# Patient Record
Sex: Male | Born: 1985 | Race: White | Hispanic: No | Marital: Single | Smoking: Current every day smoker
Health system: Southern US, Community
[De-identification: ages and names within clinical notes are randomized; demographics above are authoritative.]

## PROBLEM LIST (undated history)

## (undated) DIAGNOSIS — J189 Pneumonia, unspecified organism: Secondary | ICD-10-CM

## (undated) DIAGNOSIS — F419 Anxiety disorder, unspecified: Secondary | ICD-10-CM

## (undated) DIAGNOSIS — F909 Attention-deficit hyperactivity disorder, unspecified type: Secondary | ICD-10-CM

## (undated) DIAGNOSIS — B1910 Unspecified viral hepatitis B without hepatic coma: Secondary | ICD-10-CM

## (undated) DIAGNOSIS — F32A Depression, unspecified: Secondary | ICD-10-CM

## (undated) HISTORY — PX: NO PAST SURGERIES: SHX2092

---

## 2008-12-03 ENCOUNTER — Emergency Department (HOSPITAL_COMMUNITY): Admission: EM | Admit: 2008-12-03 | Discharge: 2008-12-03 | Payer: Self-pay | Admitting: Emergency Medicine

## 2011-09-12 LAB — RAPID URINE DRUG SCREEN, HOSP PERFORMED
Barbiturates: NOT DETECTED
Opiates: POSITIVE — AB
Tetrahydrocannabinol: POSITIVE — AB

## 2011-09-12 LAB — BASIC METABOLIC PANEL
Chloride: 102 mEq/L (ref 96–112)
GFR calc non Af Amer: >60 mL/min (ref >60)
Glucose, Bld: 118 mg/dL — ABNORMAL HIGH (ref 70–99)
Potassium: 4.1 mEq/L (ref 3.5–5.1)
Sodium: 140 mEq/L (ref 135–145)

## 2013-10-20 ENCOUNTER — Ambulatory Visit: Payer: Self-pay | Admitting: Family Medicine

## 2013-10-24 ENCOUNTER — Encounter (INDEPENDENT_AMBULATORY_CARE_PROVIDER_SITE_OTHER): Payer: Self-pay

## 2013-10-24 ENCOUNTER — Ambulatory Visit (INDEPENDENT_AMBULATORY_CARE_PROVIDER_SITE_OTHER): Payer: Self-pay | Admitting: Family Medicine

## 2013-10-24 ENCOUNTER — Encounter: Payer: Self-pay | Admitting: Family Medicine

## 2013-10-24 ENCOUNTER — Ambulatory Visit (INDEPENDENT_AMBULATORY_CARE_PROVIDER_SITE_OTHER): Payer: Self-pay

## 2013-10-24 VITALS — BP 136/88 | HR 91 | Temp 98.4°F | Ht 66.0 in | Wt 136.4 lb

## 2013-10-24 DIAGNOSIS — M549 Dorsalgia, unspecified: Secondary | ICD-10-CM

## 2013-10-24 DIAGNOSIS — M546 Pain in thoracic spine: Secondary | ICD-10-CM

## 2013-10-24 MED ORDER — NAPROXEN 500 MG PO TABS: 500.0000 mg | ORAL_TABLET | Freq: Two times a day (BID) | ORAL | Status: DC

## 2013-10-24 MED ORDER — CYCLOBENZAPRINE HCL 10 MG PO TABS: 10.0000 mg | ORAL_TABLET | Freq: Three times a day (TID) | ORAL | Status: DC | PRN
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
## 2018-12-04 ENCOUNTER — Emergency Department (HOSPITAL_COMMUNITY): Payer: Self-pay

## 2018-12-04 ENCOUNTER — Encounter (HOSPITAL_COMMUNITY): Payer: Self-pay

## 2018-12-04 ENCOUNTER — Emergency Department (HOSPITAL_COMMUNITY)
Admission: EM | Admit: 2018-12-04 | Discharge: 2018-12-04 | Disposition: A | Discharge status: Home / Self Care | Payer: Self-pay | Attending: Emergency Medicine | Admitting: Emergency Medicine

## 2018-12-04 DIAGNOSIS — S6291XA Unspecified fracture of right wrist and hand, initial encounter for closed fracture: Secondary | ICD-10-CM

## 2018-12-04 DIAGNOSIS — S0990XA Unspecified injury of head, initial encounter: Secondary | ICD-10-CM | POA: Insufficient documentation

## 2018-12-04 DIAGNOSIS — S161XXA Strain of muscle, fascia and tendon at neck level, initial encounter: Secondary | ICD-10-CM | POA: Insufficient documentation

## 2018-12-04 DIAGNOSIS — Y929 Unspecified place or not applicable: Secondary | ICD-10-CM | POA: Insufficient documentation

## 2018-12-04 DIAGNOSIS — Y9389 Activity, other specified: Secondary | ICD-10-CM | POA: Insufficient documentation

## 2018-12-04 DIAGNOSIS — S51011A Laceration without foreign body of right elbow, initial encounter: Secondary | ICD-10-CM | POA: Insufficient documentation

## 2018-12-04 DIAGNOSIS — Y999 Unspecified external cause status: Secondary | ICD-10-CM | POA: Insufficient documentation

## 2018-12-04 LAB — COMPREHENSIVE METABOLIC PANEL
ALT: 14 U/L (ref 0–44)
AST: 23 U/L (ref 15–41)
Albumin: 4.3 g/dL (ref 3.5–5.0)
Alkaline Phosphatase: 58 U/L (ref 38–126)
Anion gap: 11 (ref 5–15)
BUN: 13 mg/dL (ref 6–20)
CO2: 20 mmol/L — ABNORMAL LOW (ref 22–32)
Calcium: 9.2 mg/dL (ref 8.9–10.3)
Chloride: 106 mmol/L (ref 98–111)
Creatinine, Ser: 0.93 mg/dL (ref 0.61–1.24)
GFR calc non Af Amer: >60 mL/min (ref >60)
Glucose, Bld: 93 mg/dL (ref 70–99)
POTASSIUM: 4.2 mmol/L (ref 3.5–5.1)
Sodium: 137 mmol/L (ref 135–145)
Total Bilirubin: 0.5 mg/dL (ref 0.3–1.2)
Total Protein: 7 g/dL (ref 6.5–8.1)

## 2018-12-04 LAB — CBC
HCT: 50.4 % (ref 39.0–52.0)
Hemoglobin: 16.4 g/dL (ref 13.0–17.0)
MCH: 31.9 pg (ref 26.0–34.0)
MCHC: 32.5 g/dL (ref 30.0–36.0)
MCV: 98.1 fL (ref 80.0–100.0)
Platelets: 223 10*3/uL (ref 150–400)
RBC: 5.14 MIL/uL (ref 4.22–5.81)
RDW: 12.9 % (ref 11.5–15.5)
WBC: 10.2 10*3/uL (ref 4.0–10.5)
nRBC: 0 % (ref 0.0–0.2)

## 2018-12-04 LAB — ETHANOL: ALCOHOL ETHYL (B): 77 mg/dL — AB (ref <10)

## 2018-12-04 MED ORDER — FENTANYL CITRATE (PF) 100 MCG/2ML IJ SOLN
INTRAMUSCULAR | Status: AC
  Filled 2018-12-04: qty 2

## 2018-12-04 MED ORDER — LIDOCAINE-EPINEPHRINE (PF) 2 %-1:200000 IJ SOLN
10.0000 mL | Freq: Once | INTRAMUSCULAR | Status: DC
  Filled 2018-12-04: qty 20

## 2018-12-04 MED ORDER — FENTANYL CITRATE (PF) 100 MCG/2ML IJ SOLN
INTRAMUSCULAR | Status: AC | PRN
  Administered 2018-12-04: 100 ug via INTRAVENOUS

## 2018-12-04 MED ORDER — IBUPROFEN 800 MG PO TABS: 800.0000 mg | ORAL_TABLET | Freq: Three times a day (TID) | ORAL | 0 refills | Status: DC | PRN

## 2018-12-04 MED ORDER — METHOCARBAMOL 500 MG PO TABS: 500.0000 mg | ORAL_TABLET | Freq: Four times a day (QID) | ORAL | 0 refills | Status: DC | PRN
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
## 2018-12-04 NOTE — ED Notes (Signed)
Patient transported to CT 

## 2018-12-04 NOTE — ED Notes (Signed)
Discharge instructions reviewed with patient and patients family. Patients mother states ortho gave prescriptions.

## 2018-12-06 ENCOUNTER — Encounter: Payer: Self-pay | Admitting: Family Medicine

## 2018-12-22 DIAGNOSIS — S62316A Displaced fracture of base of fifth metacarpal bone, right hand, initial encounter for closed fracture: Secondary | ICD-10-CM | POA: Insufficient documentation

## 2018-12-22 DIAGNOSIS — S62629A Displaced fracture of medial phalanx of unspecified finger, initial encounter for closed fracture: Secondary | ICD-10-CM | POA: Insufficient documentation
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
## 2023-07-27 ENCOUNTER — Encounter (HOSPITAL_COMMUNITY): Payer: Self-pay

## 2023-07-27 ENCOUNTER — Emergency Department (HOSPITAL_COMMUNITY): Payer: MEDICAID

## 2023-07-27 ENCOUNTER — Emergency Department (HOSPITAL_COMMUNITY)
Admission: EM | Admit: 2023-07-27 | Discharge: 2023-07-28 | Disposition: A | Discharge status: Home / Self Care | Payer: MEDICAID | Attending: Emergency Medicine | Admitting: Emergency Medicine

## 2023-07-27 ENCOUNTER — Other Ambulatory Visit: Payer: Self-pay

## 2023-07-27 DIAGNOSIS — S68114A Complete traumatic metacarpophalangeal amputation of right ring finger, initial encounter: Secondary | ICD-10-CM | POA: Insufficient documentation

## 2023-07-27 DIAGNOSIS — W28XXXA Contact with powered lawn mower, initial encounter: Secondary | ICD-10-CM | POA: Diagnosis not present

## 2023-07-27 DIAGNOSIS — S68119A Complete traumatic metacarpophalangeal amputation of unspecified finger, initial encounter: Secondary | ICD-10-CM

## 2023-07-27 DIAGNOSIS — S6991XA Unspecified injury of right wrist, hand and finger(s), initial encounter: Secondary | ICD-10-CM | POA: Diagnosis present

## 2023-07-27 DIAGNOSIS — Z23 Encounter for immunization: Secondary | ICD-10-CM | POA: Diagnosis not present

## 2023-07-27 DIAGNOSIS — S68112A Complete traumatic metacarpophalangeal amputation of right middle finger, initial encounter: Secondary | ICD-10-CM | POA: Diagnosis not present

## 2023-07-27 HISTORY — DX: Unspecified viral hepatitis B without hepatic coma: B19.10

## 2023-07-27 LAB — CBC
HCT: 40.1 % (ref 39.0–52.0)
Hemoglobin: 13.8 g/dL (ref 13.0–17.0)
MCH: 32.1 pg (ref 26.0–34.0)
MCHC: 34.4 g/dL (ref 30.0–36.0)
MCV: 93.3 fL (ref 80.0–100.0)
Platelets: 181 10*3/uL (ref 150–400)
RBC: 4.3 MIL/uL (ref 4.22–5.81)
RDW: 12.9 % (ref 11.5–15.5)
WBC: 10 10*3/uL (ref 4.0–10.5)
nRBC: 0 % (ref 0.0–0.2)

## 2023-07-27 LAB — BASIC METABOLIC PANEL
Anion gap: 9 (ref 5–15)
BUN: 8 mg/dL (ref 6–20)
CO2: 25 mmol/L (ref 22–32)
Calcium: 8.4 mg/dL — ABNORMAL LOW (ref 8.9–10.3)
Chloride: 105 mmol/L (ref 98–111)
Creatinine, Ser: 1.01 mg/dL (ref 0.61–1.24)
GFR, Estimated: >60 mL/min (ref >60)
Glucose, Bld: 119 mg/dL — ABNORMAL HIGH (ref 70–99)
Potassium: 3.8 mmol/L (ref 3.5–5.1)
Sodium: 139 mmol/L (ref 135–145)

## 2023-07-27 LAB — HEPATIC FUNCTION PANEL
ALT: 14 U/L (ref 0–44)
AST: 19 U/L (ref 15–41)
Albumin: 3.8 g/dL (ref 3.5–5.0)
Alkaline Phosphatase: 49 U/L (ref 38–126)
Bilirubin, Direct: <0.1 mg/dL (ref 0.0–0.2)
Total Bilirubin: 0.9 mg/dL (ref 0.3–1.2)
Total Protein: 6.4 g/dL — ABNORMAL LOW (ref 6.5–8.1)

## 2023-07-27 LAB — TYPE AND SCREEN
ABO/RH(D): A POS
Antibody Screen: NEGATIVE

## 2023-07-27 LAB — ETHANOL: Alcohol, Ethyl (B): 125 mg/dL — ABNORMAL HIGH (ref <10)

## 2023-07-27 MED ORDER — TETANUS-DIPHTH-ACELL PERTUSSIS 5-2.5-18.5 LF-MCG/0.5 IM SUSY
0.5000 mL | PREFILLED_SYRINGE | Freq: Once | INTRAMUSCULAR | Status: AC
  Administered 2023-07-27: 0.5 mL via INTRAMUSCULAR
  Filled 2023-07-27: qty 0.5

## 2023-07-27 MED ORDER — CEFAZOLIN SODIUM-DEXTROSE 1-4 GM/50ML-% IV SOLN
1.0000 g | Freq: Once | INTRAVENOUS | Status: AC
  Administered 2023-07-27: 1 g via INTRAVENOUS
  Filled 2023-07-27: qty 50

## 2023-07-27 MED ORDER — HYDROMORPHONE HCL 1 MG/ML IJ SOLN
1.0000 mg | Freq: Once | INTRAMUSCULAR | Status: AC
  Administered 2023-07-27: 1 mg via INTRAVENOUS
  Filled 2023-07-27: qty 1

## 2023-07-27 MED ORDER — HYDROMORPHONE HCL 1 MG/ML IJ SOLN
0.5000 mg | Freq: Once | INTRAMUSCULAR | Status: AC
  Administered 2023-07-27: 0.5 mg via INTRAVENOUS
  Filled 2023-07-27: qty 1
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
## 2023-07-27 NOTE — ED Triage Notes (Signed)
BIB EMS/ pt cut right fingers with lawn mower blade/ right middle-tip of finger is amputated/ laceration to all other fingers/ bleeding controlled/ pt A&Ox4/ Fentanyl given enroute/ 7/10 pain upon arrival.
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
## 2023-07-27 NOTE — ED Provider Notes (Signed)
[facility] AT [facility] Provider Note   CSN: [id number] Arrival date & time: 07/27/23  2130     History {Add pertinent medical, surgical, social history, OB history to HPI:1} Chief Complaint  Patient presents with   Hand Injury    Alan Herrera. is a 37 y.o. male.  The history is provided by the patient and medical records.  Hand Injury  37 year old male with history of hepatitis B, presenting to the ED for right hand injury.  Reports he was mowing the lawn when mower got clogged up with damp grass.  He states he tried to clear this quickly with his fingertips, however hand got drawn into the blade.  He suffered partial amputations of right third and fourth digits and some other smaller lacerations.  He is right-hand dominant.  Does have EtOH on board.  Unsure of last tetanus.  Home Medications Prior to Admission medications   Medication Sig Start Date End Date Taking? Authorizing Provider  cyclobenzaprine (FLEXERIL) 10 MG tablet Take 1 tablet (10 mg total) by mouth 3 (three) times daily as needed for muscle spasms. 10/24/13   Deatra Canter, FNP  naproxen (NAPROSYN) 500 MG tablet Take 1 tablet (500 mg total) by mouth 2 (two) times daily with a meal. 10/24/13   [place], Anselm Pancoast, FNP      Allergies    Patient has no known allergies.    Review of Systems   Review of Systems  Skin:  Positive for wound.  All other systems reviewed and are negative.   Physical Exam Updated Vital Signs BP (!) 138/91 (BP Location: Left Arm)   Pulse 74   Temp 98.1 F (36.7 C) (Oral)   Resp 16   Wt 56.7 kg   SpO2 100%   BMI 19.58 kg/m   Physical Exam Vitals and nursing note reviewed.  Constitutional:      Appearance: He is well-developed.  HENT:     Head: Normocephalic and atraumatic.  Eyes:     Conjunctiva/sclera: Conjunctivae normal.     Pupils: Pupils are equal, round, and reactive to light.  Cardiovascular:     Rate and Rhythm: Normal  rate and regular rhythm.     Heart sounds: Normal heart sounds.  Pulmonary:     Effort: Pulmonary effort is normal.     Breath sounds: Normal breath sounds.  Abdominal:     General: Bowel sounds are normal.     Palpations: Abdomen is soft.  Musculoskeletal:        General: Normal range of motion.     Cervical back: Normal range of motion.     Comments: Right hand with amputations to right 3rd distal tip and 4th distal fingers palmar aspect, 4th digit also has deep dorsal laceration along DIP joint, there is some oozing from wounds but no pulsatile bleeding, able to flex/extend, maintains sensation   Skin:    General: Skin is warm and dry.  Neurological:     Mental Status: He is alert and oriented to person, place, and time.        ED Results / Procedures / Treatments   Labs (all labs ordered are listed, but only abnormal results are displayed) Labs Reviewed  BASIC METABOLIC PANEL - Abnormal; Notable for the following components:      Result Value   Glucose, Bld 119 (*)    Calcium 8.4 (*)    All other components within normal limits  ETHANOL - Abnormal; Notable  for the following components:   Alcohol, Ethyl (B) 125 (*)    All other components within normal limits  HEPATIC FUNCTION PANEL - Abnormal; Notable for the following components:   Total Protein 6.4 (*)    All other components within normal limits  CBC  TYPE AND SCREEN    EKG None  Radiology DG Hand Complete Right  Result Date: 07/27/2023 CLINICAL DATA:  Traumatic amputation.  Lawnmower EXAM: RIGHT HAND - COMPLETE 3+ VIEW COMPARISON:  None Available. FINDINGS: Study limited due to imaging in the fingers in the slightly flexed position. Fractures noted through the distal phalanx of the right middle finger and middle phalanx of the right ring finger. No definite additional fractures. Deformity at the base of the right 5th metacarpal likely related to old healed injury. Old ulnar styloid fracture. IMPRESSION: Limited  study. Fractures through the distal phalanx of the middle finger and middle phalanx of the ring finger. Electronically Signed   By: Charlett Nose M.D.   On: 07/27/2023 23:15   DG Wrist Complete Right  Result Date: 07/27/2023 CLINICAL DATA:  Lawnmower injury. traumatic amputation EXAM: RIGHT WRIST - COMPLETE 3+ VIEW COMPARISON:  None Available. FINDINGS: Deformity at the base of the right 5th metacarpal appears to be chronic, likely related to old healed fracture. Old ulnar styloid fracture noted. No acute fracture within the wrist. No subluxation or dislocation. IMPRESSION: No acute bony abnormality within the wrist. Electronically Signed   By: Charlett Nose M.D.   On: 07/27/2023 23:13    Procedures Procedures  {Document cardiac monitor, telemetry assessment procedure when appropriate:1}  Medications Ordered in ED Medications  HYDROmorphone (DILAUDID) injection 0.5 mg (0.5 mg Intravenous Given 07/27/23 2141)    ED Course/ Medical Decision Making/ A&P   {   Click here for ABCD2, HEART and other calculatorsREFRESH Note before signing :1}                              Medical Decision Making Amount and/or Complexity of Data Reviewed Labs: ordered.  Risk Prescription drug management.   37 y.o. M here with hand vs lawnmower while trying to clear grass caked in the blades.  States he does this all the time, however this time had got sucked into the blade.  He has amputation of right 3rd distal finger at DIP joint and partial amputation of right 4th digit-- essentially has sheared off palmar aspect distal phalanx, secondary wound middle phalanx with open fracture.  Tetanus updated, ancef given.    Discussed with hand surgery, Dr. Frazier Butt-- call office in AM, will see first thing.  Plan for outpatient repair on Wednesday at surgical center.  Recommends xeroform, non-stick dressing, dorsal splint.  Rx keflex.  Final Clinical Impression(s) / ED Diagnoses Final diagnoses:  None    Rx / DC  Orders ED Discharge Orders     None

## 2023-07-28 MED ORDER — CEPHALEXIN 500 MG PO CAPS: 500.0000 mg | ORAL_CAPSULE | Freq: Four times a day (QID) | ORAL | 0 refills | Status: DC

## 2023-07-28 MED ORDER — HYDROMORPHONE HCL 1 MG/ML IJ SOLN
1.0000 mg | Freq: Once | INTRAMUSCULAR | Status: AC
  Administered 2023-07-28: 1 mg via INTRAVENOUS
  Filled 2023-07-28: qty 1

## 2023-07-28 MED ORDER — OXYCODONE-ACETAMINOPHEN 5-325 MG PO TABS: 1.0000 | ORAL_TABLET | ORAL | 0 refills | Status: DC | PRN

## 2023-07-28 NOTE — Discharge Instructions (Signed)
Follow-up with Dr. Frazier Butt in office in the morning-- I would call first thing and they will give you time to come in. Leave dressings in place for now. Take the prescribed medication as directed. Return to the ED for new or worsening symptoms.

## 2023-07-28 NOTE — Progress Notes (Signed)
Orthopedic Tech Progress Note Patient Details:  Alan Herrera [birth year] [id number]  Ortho Devices Type of Ortho Device: Other (comment) Ortho Device/Splint Location: Dorsal/Volar RUE Ortho Device/Splint Interventions: Ordered, Application, Adjustment   Post Interventions Patient Tolerated: Well Instructions Provided: Care of device High dorsal/volar applied to keep patients fingers straight. [name] 07/28/2023, 1:13 AM

## 2023-07-28 NOTE — ED Notes (Signed)
Right 2nd, 3rd, 4th finger cleaned and irrigated with NS/ wrapped with xeroform and wrapped with kerlix/ orhto tech at bedside to apply splint

## 2023-07-29 ENCOUNTER — Other Ambulatory Visit: Payer: Self-pay

## 2023-07-29 ENCOUNTER — Encounter (HOSPITAL_COMMUNITY): Payer: Self-pay | Admitting: Orthopedic Surgery

## 2023-07-29 NOTE — Progress Notes (Signed)
PCP - none Cardiologist - none  Chest x-ray - n/a EKG - n/a Stress Test - n/a ECHO - n/a Cardiac Cath - n/a  ICD Pacemaker/Loop - n/a  Sleep Study -  n/a CPAP - none  Diabetes - n/a  ERAS: Clear liquids til 2 PM DOS.  STOP now taking any Aspirin (unless otherwise instructed by your surgeon), Aleve, Naproxen, Ibuprofen, Motrin, Advil, Goody's, BC's, all herbal medications, fish oil, and all vitamins.   Coronavirus Screening Do you have any of the following symptoms:  Cough yes/no: No Fever (>100.27F)  yes/no: No Runny nose yes/no: No Sore throat yes/no: No Difficulty breathing/shortness of breath  yes/no: No  Have you traveled in the last 14 days and where? yes/no: No  Patient verbalized understanding of instructions that were given via phone.

## 2023-07-30 ENCOUNTER — Ambulatory Visit (HOSPITAL_COMMUNITY): Payer: MEDICAID

## 2023-07-30 ENCOUNTER — Encounter (HOSPITAL_COMMUNITY): Payer: Self-pay | Admitting: Orthopedic Surgery

## 2023-07-30 ENCOUNTER — Encounter (HOSPITAL_COMMUNITY)
Admission: RE | Disposition: A | Discharge status: Home / Self Care | Payer: Self-pay | Source: Home / Self Care | Attending: Orthopedic Surgery

## 2023-07-30 ENCOUNTER — Ambulatory Visit (HOSPITAL_BASED_OUTPATIENT_CLINIC_OR_DEPARTMENT_OTHER): Payer: MEDICAID

## 2023-07-30 ENCOUNTER — Ambulatory Visit (HOSPITAL_COMMUNITY)
Admission: RE | Admit: 2023-07-30 | Discharge: 2023-07-30 | Disposition: A | Discharge status: Home / Self Care | Payer: MEDICAID | Attending: Orthopedic Surgery | Admitting: Orthopedic Surgery

## 2023-07-30 ENCOUNTER — Other Ambulatory Visit: Payer: Self-pay

## 2023-07-30 DIAGNOSIS — W28XXXA Contact with powered lawn mower, initial encounter: Secondary | ICD-10-CM | POA: Diagnosis not present

## 2023-07-30 DIAGNOSIS — F418 Other specified anxiety disorders: Secondary | ICD-10-CM | POA: Diagnosis not present

## 2023-07-30 DIAGNOSIS — F17210 Nicotine dependence, cigarettes, uncomplicated: Secondary | ICD-10-CM | POA: Diagnosis not present

## 2023-07-30 DIAGNOSIS — S62630B Displaced fracture of distal phalanx of right index finger, initial encounter for open fracture: Secondary | ICD-10-CM | POA: Insufficient documentation

## 2023-07-30 DIAGNOSIS — S68612A Complete traumatic transphalangeal amputation of right middle finger, initial encounter: Secondary | ICD-10-CM | POA: Diagnosis present

## 2023-07-30 DIAGNOSIS — Z89021 Acquired absence of right finger(s): Secondary | ICD-10-CM | POA: Diagnosis not present

## 2023-07-30 DIAGNOSIS — S62624B Displaced fracture of medial phalanx of right ring finger, initial encounter for open fracture: Secondary | ICD-10-CM | POA: Diagnosis not present

## 2023-07-30 HISTORY — DX: Attention-deficit hyperactivity disorder, unspecified type: F90.9

## 2023-07-30 HISTORY — PX: INCISION AND DRAINAGE OF WOUND: SHX1803

## 2023-07-30 HISTORY — DX: Pneumonia, unspecified organism: J18.9

## 2023-07-30 HISTORY — PX: CLOSED REDUCTION FINGER WITH PERCUTANEOUS PINNING: SHX5612

## 2023-07-30 HISTORY — PX: AMPUTATION: SHX166

## 2023-07-30 HISTORY — DX: Depression, unspecified: F32.A

## 2023-07-30 HISTORY — DX: Anxiety disorder, unspecified: F41.9

## 2023-07-30 SURGERY — IRRIGATION AND DEBRIDEMENT WOUND
Anesthesia: General | Site: Hand | Laterality: Right

## 2023-07-30 MED ORDER — MIDAZOLAM HCL 2 MG/2ML IJ SOLN
INTRAMUSCULAR | Status: AC
  Filled 2023-07-30: qty 2

## 2023-07-30 MED ORDER — ROCURONIUM BROMIDE 10 MG/ML (PF) SYRINGE
PREFILLED_SYRINGE | INTRAVENOUS | Status: DC | PRN
  Administered 2023-07-30: 50 mg via INTRAVENOUS

## 2023-07-30 MED ORDER — GLYCOPYRROLATE PF 0.2 MG/ML IJ SOSY
PREFILLED_SYRINGE | INTRAMUSCULAR | Status: AC
  Filled 2023-07-30: qty 1

## 2023-07-30 MED ORDER — PROPOFOL 10 MG/ML IV BOLUS
INTRAVENOUS | Status: AC
  Filled 2023-07-30: qty 20

## 2023-07-30 MED ORDER — SUCCINYLCHOLINE CHLORIDE 200 MG/10ML IV SOSY
PREFILLED_SYRINGE | INTRAVENOUS | Status: DC | PRN
  Administered 2023-07-30: 100 mg via INTRAVENOUS

## 2023-07-30 MED ORDER — NEOSTIGMINE METHYLSULFATE 3 MG/3ML IV SOSY
PREFILLED_SYRINGE | INTRAVENOUS | Status: AC
  Filled 2023-07-30: qty 3

## 2023-07-30 MED ORDER — 0.9 % SODIUM CHLORIDE (POUR BTL) OPTIME
TOPICAL | Status: DC | PRN
  Administered 2023-07-30: 1000 mL

## 2023-07-30 MED ORDER — FENTANYL CITRATE (PF) 250 MCG/5ML IJ SOLN
INTRAMUSCULAR | Status: DC | PRN
  Administered 2023-07-30 (×2): 50 ug via INTRAVENOUS
  Administered 2023-07-30: 100 ug via INTRAVENOUS

## 2023-07-30 MED ORDER — DEXAMETHASONE SODIUM PHOSPHATE 10 MG/ML IJ SOLN
INTRAMUSCULAR | Status: AC
  Filled 2023-07-30: qty 1

## 2023-07-30 MED ORDER — AMISULPRIDE (ANTIEMETIC) 5 MG/2ML IV SOLN: 10.0000 mg | Freq: Once | INTRAVENOUS | Status: DC | PRN

## 2023-07-30 MED ORDER — LIDOCAINE 2% (20 MG/ML) 5 ML SYRINGE
INTRAMUSCULAR | Status: AC
  Filled 2023-07-30: qty 5

## 2023-07-30 MED ORDER — CEFAZOLIN SODIUM-DEXTROSE 2-4 GM/100ML-% IV SOLN
2.0000 g | INTRAVENOUS | Status: AC
  Administered 2023-07-30: 2 g via INTRAVENOUS
  Filled 2023-07-30: qty 100

## 2023-07-30 MED ORDER — ONDANSETRON HCL 4 MG/2ML IJ SOLN: 4.0000 mg | Freq: Once | INTRAMUSCULAR | Status: DC | PRN

## 2023-07-30 MED ORDER — SUGAMMADEX SODIUM 200 MG/2ML IV SOLN
INTRAVENOUS | Status: DC | PRN
  Administered 2023-07-30: 200 mg via INTRAVENOUS

## 2023-07-30 MED ORDER — DEXAMETHASONE SODIUM PHOSPHATE 10 MG/ML IJ SOLN
INTRAMUSCULAR | Status: DC | PRN
  Administered 2023-07-30: 4 mg via INTRAVENOUS

## 2023-07-30 MED ORDER — KETOROLAC TROMETHAMINE 30 MG/ML IJ SOLN
INTRAMUSCULAR | Status: AC
  Filled 2023-07-30: qty 1

## 2023-07-30 MED ORDER — OXYCODONE HCL 5 MG PO TABS: 5.0000 mg | ORAL_TABLET | ORAL | 0 refills | Status: AC | PRN

## 2023-07-30 MED ORDER — BUPIVACAINE HCL (PF) 0.25 % IJ SOLN
INTRAMUSCULAR | Status: DC | PRN
  Administered 2023-07-30: 20 mL

## 2023-07-30 MED ORDER — FENTANYL CITRATE (PF) 250 MCG/5ML IJ SOLN
INTRAMUSCULAR | Status: AC
  Filled 2023-07-30: qty 5

## 2023-07-30 MED ORDER — ONDANSETRON HCL 4 MG/2ML IJ SOLN
INTRAMUSCULAR | Status: DC | PRN
  Administered 2023-07-30: 4 mg via INTRAVENOUS

## 2023-07-30 MED ORDER — BUPIVACAINE HCL (PF) 0.25 % IJ SOLN
INTRAMUSCULAR | Status: AC
  Filled 2023-07-30: qty 30

## 2023-07-30 MED ORDER — LIDOCAINE 2% (20 MG/ML) 5 ML SYRINGE
INTRAMUSCULAR | Status: DC | PRN
  Administered 2023-07-30: 60 mg via INTRAVENOUS

## 2023-07-30 MED ORDER — ONDANSETRON HCL 4 MG/2ML IJ SOLN
INTRAMUSCULAR | Status: AC
  Filled 2023-07-30: qty 2

## 2023-07-30 MED ORDER — SODIUM CHLORIDE 0.9 % IR SOLN
Status: DC | PRN
  Administered 2023-07-30 (×2): 3000 mL

## 2023-07-30 MED ORDER — MIDAZOLAM HCL 2 MG/2ML IJ SOLN
INTRAMUSCULAR | Status: DC | PRN
  Administered 2023-07-30: 2 mg via INTRAVENOUS

## 2023-07-30 MED ORDER — FENTANYL CITRATE (PF) 100 MCG/2ML IJ SOLN: 25.0000 ug | INTRAMUSCULAR | Status: DC | PRN

## 2023-07-30 MED ORDER — CHLORHEXIDINE GLUCONATE 0.12 % MT SOLN
OROMUCOSAL | Status: AC
  Filled 2023-07-30: qty 15

## 2023-07-30 MED ORDER — CEPHALEXIN 500 MG PO CAPS: 500.0000 mg | ORAL_CAPSULE | Freq: Four times a day (QID) | ORAL | 0 refills | Status: AC

## 2023-07-30 MED ORDER — PROPOFOL 10 MG/ML IV BOLUS
INTRAVENOUS | Status: DC | PRN
  Administered 2023-07-30: 100 mg via INTRAVENOUS

## 2023-07-30 MED ORDER — LACTATED RINGERS IV SOLN: INTRAVENOUS | Status: DC | PRN

## 2023-07-30 SURGICAL SUPPLY — 86 items
APL PRP STRL LF DISP 70% ISPRP (MISCELLANEOUS) ×3
BAG COUNTER SPONGE SURGICOUNT (BAG) ×3 IMPLANT
BAG SPNG CNTER NS LX DISP (BAG) ×3
BNDG CMPR 5X3 KNIT ELC UNQ LF (GAUZE/BANDAGES/DRESSINGS) ×6
BNDG CMPR 75X21 PLY HI ABS (MISCELLANEOUS)
BNDG CMPR 9X4 STRL LF SNTH (GAUZE/BANDAGES/DRESSINGS)
BNDG COHESIVE 1X5 TAN STRL LF (GAUZE/BANDAGES/DRESSINGS) ×3 IMPLANT
BNDG ELASTIC 2X5.8 VLCR STR LF (GAUZE/BANDAGES/DRESSINGS) ×3 IMPLANT
BNDG ELASTIC 3INX 5YD STR LF (GAUZE/BANDAGES/DRESSINGS) ×3 IMPLANT
BNDG ELASTIC 4X5.8 VLCR STR LF (GAUZE/BANDAGES/DRESSINGS) ×3 IMPLANT
BNDG ESMARK 4X9 LF (GAUZE/BANDAGES/DRESSINGS) IMPLANT
BNDG GAUZE DERMACEA FLUFF 4 (GAUZE/BANDAGES/DRESSINGS) ×6 IMPLANT
BNDG GZE DERMACEA 4 6PLY (GAUZE/BANDAGES/DRESSINGS) ×6
CHLORAPREP W/TINT 26 (MISCELLANEOUS) ×3 IMPLANT
CORD BIPOLAR FORCEPS 12FT (ELECTRODE) ×3 IMPLANT
COVER BACK TABLE 60X90IN (DRAPES) IMPLANT
COVER MAYO STAND STRL (DRAPES) ×3 IMPLANT
COVER SURGICAL LIGHT HANDLE (MISCELLANEOUS) ×3 IMPLANT
CUFF TOURN SGL QUICK 18X4 (TOURNIQUET CUFF) ×3 IMPLANT
CUFF TOURN SGL QUICK 24 (TOURNIQUET CUFF)
CUFF TRNQT CYL 24X4X16.5-23 (TOURNIQUET CUFF) IMPLANT
DRAIN PENROSE 0.25X18 (DRAIN) IMPLANT
DRAIN PENROSE 18X1/4 LTX STRL (DRAIN) IMPLANT
DRAPE HALF SHEET 40X57 (DRAPES) ×3 IMPLANT
DRAPE OEC MINIVIEW 54X84 (DRAPES) IMPLANT
DRAPE SURG 17X23 STRL (DRAPES) ×3 IMPLANT
DRSG ADAPTIC 3X8 NADH LF (GAUZE/BANDAGES/DRESSINGS) ×3 IMPLANT
GAUZE SPONGE 2X2 8PLY STRL LF (GAUZE/BANDAGES/DRESSINGS) IMPLANT
GAUZE SPONGE 4X4 12PLY STRL (GAUZE/BANDAGES/DRESSINGS) ×3 IMPLANT
GAUZE STRETCH 2X75IN STRL (MISCELLANEOUS) IMPLANT
GAUZE XEROFORM 1X8 LF (GAUZE/BANDAGES/DRESSINGS) ×3 IMPLANT
GAUZE XEROFORM 5X9 LF (GAUZE/BANDAGES/DRESSINGS) IMPLANT
GLOVE BIO SURGEON STRL SZ7 (GLOVE) ×3 IMPLANT
GLOVE SURG UNDER POLY LF SZ7 (GLOVE) ×3 IMPLANT
GOWN STRL REUS W/ TWL LRG LVL3 (GOWN DISPOSABLE) ×6 IMPLANT
GOWN STRL REUS W/ TWL XL LVL3 (GOWN DISPOSABLE) ×6 IMPLANT
GOWN STRL REUS W/TWL LRG LVL3 (GOWN DISPOSABLE) ×6
GOWN STRL REUS W/TWL XL LVL3 (GOWN DISPOSABLE) ×6
IV CATH 18G X1.75 CATHLON (IV SOLUTION) IMPLANT
K-WIRE DBL TROCAR .035X4 (WIRE) ×3
K-WIRE DBL TROCAR .045X4 (WIRE) ×6
KIT BASIN OR (CUSTOM PROCEDURE TRAY) ×3 IMPLANT
KIT TURNOVER KIT B (KITS) ×3 IMPLANT
KWIRE DBL TROCAR .035X4 (WIRE) IMPLANT
KWIRE DBL TROCAR .045X4 (WIRE) IMPLANT
LOOP VASCLR MAXI BLUE 18IN ST (MISCELLANEOUS) IMPLANT
LOOP VASCULAR MAXI 18 BLUE (MISCELLANEOUS)
LOOPS VASCLR MAXI BLUE 18IN ST (MISCELLANEOUS) IMPLANT
MANIFOLD NEPTUNE II (INSTRUMENTS) ×3 IMPLANT
NDL HYPO 25GX1X1/2 BEV (NEEDLE) IMPLANT
NDL HYPO 25X1 1.5 SAFETY (NEEDLE) ×3 IMPLANT
NDL KEITH (NEEDLE) IMPLANT
NEEDLE HYPO 25GX1X1/2 BEV (NEEDLE) ×3 IMPLANT
NEEDLE HYPO 25X1 1.5 SAFETY (NEEDLE) ×3 IMPLANT
NEEDLE KEITH (NEEDLE) IMPLANT
NS IRRIG 1000ML POUR BTL (IV SOLUTION) ×3 IMPLANT
PACK ORTHO EXTREMITY (CUSTOM PROCEDURE TRAY) ×3 IMPLANT
PAD ABD 8X10 STRL (GAUZE/BANDAGES/DRESSINGS) ×3 IMPLANT
PAD ARMBOARD 7.5X6 YLW CONV (MISCELLANEOUS) ×6 IMPLANT
PAD CAST 4YDX4 CTTN HI CHSV (CAST SUPPLIES) ×6 IMPLANT
PADDING CAST ABS COTTON 3X4 (CAST SUPPLIES) ×3 IMPLANT
PADDING CAST COTTON 4X4 STRL (CAST SUPPLIES) ×6
SET CYSTO W/LG BORE CLAMP LF (SET/KITS/TRAYS/PACK) IMPLANT
SOAP 2 % CHG 4 OZ (WOUND CARE) ×3 IMPLANT
SPECIMEN JAR SMALL (MISCELLANEOUS) ×3 IMPLANT
SPLINT PLASTER CAST FAST 3X15 (CAST SUPPLIES) IMPLANT
SPLINT PLASTER CAST XFAST 3X15 (CAST SUPPLIES) ×3 IMPLANT
SPLINT PLASTER CAST XFAST 5X30 (CAST SUPPLIES) IMPLANT
SPONGE T-LAP 4X18 ~~LOC~~+RFID (SPONGE) ×3 IMPLANT
SUT FIBERWIRE 3-0 18 TAPR NDL (SUTURE)
SUT MERSILENE 4 0 P 3 (SUTURE) IMPLANT
SUT PROLENE 4 0 PS 2 18 (SUTURE) IMPLANT
SUT VICRYL RAPID 5 0 P 3 (SUTURE) IMPLANT
SUT VICRYL RAPIDE 4/0 PS 2 (SUTURE) IMPLANT
SUTURE FIBERWR 3-0 18 TAPR NDL (SUTURE) IMPLANT
SWAB CULTURE ESWAB REG 1ML (MISCELLANEOUS) IMPLANT
SYR BULB EAR ULCER 3OZ GRN STR (SYRINGE) ×3 IMPLANT
SYR CONTROL 10ML LL (SYRINGE) IMPLANT
TOWEL GREEN STERILE (TOWEL DISPOSABLE) ×3 IMPLANT
TOWEL GREEN STERILE FF (TOWEL DISPOSABLE) ×6 IMPLANT
TUBE CONNECTING 12X1/4 (SUCTIONS) IMPLANT
TUBE SUCTION PSI-TEC 12FT (MISCELLANEOUS) IMPLANT
UNDERPAD 30X36 HEAVY ABSORB (UNDERPADS AND DIAPERS) ×3 IMPLANT
VASCULAR TIE MAXI BLUE 18IN ST (MISCELLANEOUS)
WATER STERILE IRR 1000ML POUR (IV SOLUTION) ×3 IMPLANT
YANKAUER SUCT BULB TIP NO VENT (SUCTIONS) IMPLANT

## 2023-07-30 NOTE — Interval H&P Note (Signed)
History and Physical Interval Note:  07/30/2023 3:32 PM  Alan Herrera.  has presented today for surgery, with the diagnosis of Right middle and ring finger amputations.  The various methods of treatment have been discussed with the patient and family. After consideration of risks, benefits and other options for treatment, the patient has consented to  Procedure(s): IRRIGATION AND DEBRIDEMENT MIDDLE AND RING FINGER WOUND (Right) IRRIGATION AND DEBRIDEMENT PERCUTANEOUS PINNING RIGHT RING FINGER MIDDLE PHALANX FRACTURE (Right) REVISION AMPUTATION OF MIDDLE FINGER WITH POSSIBLE FULL THICKNESS SKIN GRAFTING TO RING FINGER (Right) as a surgical intervention.  The patient's history has been reviewed, patient examined, no change in status, stable for surgery.  I have reviewed the patient's chart and labs.  Questions were answered to the patient's satisfaction.     Alan Herrera

## 2023-07-30 NOTE — Transfer of Care (Addendum)
Immediate Anesthesia Transfer of Care Note  Patient: Alan Herrera.  Procedure(s) Performed: IRRIGATION AND DEBRIDEMENT MIDDLE AND RING FINGER WOUND (Right) IRRIGATION AND DEBRIDEMENT PERCUTANEOUS PINNING RIGHT RING FINGER MIDDLE PHALANX FRACTURE (Right: Finger) REVISION AMPUTATION OF MIDDLE FINGER WITH FULL THICKNESS SKIN GRAFTING TO RING FINGER (Right: Hand)  Patient Location: PACU  Anesthesia Type:General  Level of Consciousness: awake, alert , and oriented  Airway & Oxygen Therapy: Patient Spontanous Breathing and Patient connected to face mask  Post-op Assessment: Report given to RN and Post -op Vital signs reviewed and stable  Post vital signs: Reviewed and stable  Last Vitals:  Vitals Value Taken Time  BP 138/93 07/30/23 1845  Temp 36.6 C 07/30/23 1819  Pulse 74 07/30/23 1848  Resp 11 07/30/23 1848  SpO2 95 % 07/30/23 1848  Vitals shown include unfiled device data.  Last Pain:  Vitals:   07/30/23 1519  PainSc: 7       Patients Stated Pain Goal: 5 (07/30/23 1519)  Complications: No notable events documented.

## 2023-07-30 NOTE — Brief Op Note (Signed)
07/30/2023  6:04 PM  PATIENT:  Alan Herrera  37 y.o. male  PRE-OPERATIVE DIAGNOSIS:  Right middle and ring finger amputations  POST-OPERATIVE DIAGNOSIS:  Right middle and ring finger amputations  PROCEDURE:  Procedure(s): IRRIGATION AND DEBRIDEMENT MIDDLE AND RING FINGER WOUND (Right) IRRIGATION AND DEBRIDEMENT PERCUTANEOUS PINNING RIGHT RING FINGER MIDDLE PHALANX FRACTURE (Right) REVISION AMPUTATION OF MIDDLE FINGER WITH FULL THICKNESS SKIN GRAFTING TO RING FINGER (Right)  Irrigation and debridement of open right index distal phalanx fracture Irrigation and debridement of open right ring middle phalanx fracture Open treatment of right index finger distal phalanx fracture Open treatment of right ring finger middle phalanx fracture Revision amputation of right middle finger Application of full-thickness skin graft to volar aspect of right ring finger, 2 cm x 1.5 cm  SURGEON:  Surgeons and Role:    * Marlyne Beards, MD - Primary  PHYSICIAN ASSISTANT:   ASSISTANTS: none   ANESTHESIA:   local and general  EBL:  <5   BLOOD ADMINISTERED:none  DRAINS: none   LOCAL MEDICATIONS USED:  MARCAINE     SPECIMEN:  No Specimen  DISPOSITION OF SPECIMEN:  N/A  COUNTS:  YES  TOURNIQUET:   Total Tourniquet Time Documented: Upper Arm (Right) - 96 minutes Total: Upper Arm (Right) - 96 minutes   DICTATION: .Reubin Milan Dictation  PLAN OF CARE: Discharge to home after PACU  PATIENT DISPOSITION:  PACU - hemodynamically stable.   Delay start of Pharmacological VTE agent (>24hrs) due to surgical blood loss or risk of bleeding: not applicable

## 2023-07-30 NOTE — Anesthesia Procedure Notes (Addendum)
Procedure Name: Intubation Date/Time: 07/30/2023 3:55 PM  Performed by: Camillia Herter, CRNAPre-anesthesia Checklist: Patient identified, Emergency Drugs available, Suction available and Patient being monitored Patient Re-evaluated:Patient Re-evaluated prior to induction Oxygen Delivery Method: Circle System Utilized Preoxygenation: Pre-oxygenation with 100% oxygen Induction Type: IV induction Laryngoscope Size: Miller and 2 Grade View: Grade II Tube type: Oral Number of attempts: 1 Airway Equipment and Method: Stylet and Oral airway Placement Confirmation: ETT inserted through vocal cords under direct vision, positive ETCO2 and breath sounds checked- equal and bilateral Tube secured with: Tape Dental Injury: Teeth and Oropharynx as per pre-operative assessment  Comments: Pt RSI, mask ventilation not attempted. Grade 2 view with Mil 2 blade; large floppy epiglottis noted. Dentition unchanged from baseline.

## 2023-07-30 NOTE — H&P (Signed)
HAND SURGERY   HPI: Patient is a 37 y.o. male who presents with multiple complex injuries to multiple fingers of the right hand.  Patient stuck his right hand in a running lawn mower several days with with severe injuries to the middle and ring fingers.  He was seen in the office this week.  He presents today for surgery.  Patient denies any changes to their medical history or new systemic symptoms today.    Past Medical History:  Diagnosis Date   ADHD (attention deficit hyperactivity disorder)    no meds   Anxiety    no meds   Depression    no meds   Hepatitis B    Pneumonia    x 1   Past Surgical History:  Procedure Laterality Date   NO PAST SURGERIES     Social History   Socioeconomic History   Marital status: Single    Spouse name: Not on file   Number of children: Not on file   Years of education: Not on file   Highest education level: Not on file  Occupational History   Not on file  Tobacco Use   Smoking status: Every Day    Current packs/day: 1.00    Average packs/day: 1 pack/day for 20.6 years (20.6 ttl pk-yrs)    Types: Cigarettes    Start date: 12/08/2002   Smokeless tobacco: Never  Vaping Use   Vaping status: Never Used  Substance and Sexual Activity   Alcohol use: Yes    Comment: drinks 3-4 x per week - 3-4 drinks wine/beer   Drug use: Yes    Types: Marijuana    Comment: last use 07/2023   Sexual activity: Yes  Other Topics Concern   Not on file  Social History Narrative   ** Merged History Encounter **       Social Determinants of Health   Financial Resource Strain: Not on file  Food Insecurity: Not on file  Transportation Needs: Not on file  Physical Activity: Not on file  Stress: Not on file  Social Connections: Not on file   History reviewed. No pertinent family history. - negative except otherwise stated in the family history section No Known Allergies Prior to Admission medications   Medication Sig Start Date End Date Taking?  Authorizing Provider  cephALEXin (KEFLEX) 500 MG capsule Take 1 capsule (500 mg total) by mouth 4 (four) times daily. 07/28/23  Yes Garlon Hatchet, PA-C  oxyCODONE-acetaminophen (PERCOCET) 5-325 MG tablet Take 1 tablet by mouth every 4 (four) hours as needed. 07/28/23  Yes Garlon Hatchet, PA-C   No results found. - Positive ROS: All other systems have been reviewed and were otherwise negative with the exception of those mentioned in the HPI and as above.  Physical Exam: General: No acute distress, resting comfortably Cardiovascular: BUE warm and well perfused, normal rate Respiratory: Normal WOB on RA Skin: Warm and dry Neurologic: Sensation intact distally Psychiatric: Patient is at baseline mood and affect  Right Upper Extremity  Transverse amputation of the middle finger through the distal phalanx.  There is a volar oblique amputation through the middle finger tip with a transverse laceration over the dorsum of the ring finger middle phalanx.  There are several small lacerations at the tip of the index finger.  All fingers arm warm and well perfused w/ BCR.     Assessment: 37 yo M w/ multiple complex injuries to the right index, middle, and ring fingers.   Plan:  OR today for irrigation and debridement of open wounds, revision amputation of right middle finger, I&D of open ring finger P2 fracture with reduction and pinning, possible full thickness skin graft from hypothenar emience to ring finger tip. . We again reviewed the risks of surgery which include bleeding, infection, damage to neurovascular structures, persistent symptoms, nonunion, malunion, stiffness, sensitivity, delayed wound healing, need for additional surgery.  Informed consent was signed.  All questions were answered.   Marlyne Beards, M.D. [facility] 3:26 PM

## 2023-07-30 NOTE — Anesthesia Preprocedure Evaluation (Addendum)
Anesthesia Evaluation    Reviewed: Allergy & Precautions, Patient's Chart, lab work & pertinent test results  Airway Mallampati: II  TM Distance: >3 FB Neck ROM: Full    Dental  (+) Dental Advisory Given, Poor Dentition, Missing, Chipped   Pulmonary Current Smoker and Patient abstained from smoking.   Pulmonary exam normal breath sounds clear to auscultation       Cardiovascular negative cardio ROS Normal cardiovascular exam Rhythm:Regular Rate:Normal     Neuro/Psych  PSYCHIATRIC DISORDERS Anxiety Depression    negative neurological ROS     GI/Hepatic negative GI ROS,,,(+) Hepatitis -, B  Endo/Other  negative endocrine ROS    Renal/GU negative Renal ROS     Musculoskeletal negative musculoskeletal ROS (+)    Abdominal   Peds  (+) ADHD Hematology negative hematology ROS (+)   Anesthesia Other Findings   Reproductive/Obstetrics                             Anesthesia Physical Anesthesia Plan  ASA: 3  Anesthesia Plan: General   Post-op Pain Management: Toradol IV (intra-op)*   Induction: Intravenous  PONV Risk Score and Plan: 2 and Midazolam, Dexamethasone and Ondansetron  Airway Management Planned: Oral ETT  Additional Equipment:   Intra-op Plan:   Post-operative Plan: Extubation in OR  Informed Consent: I have reviewed the patients History and Physical, chart, labs and discussed the procedure including the risks, benefits and alternatives for the proposed anesthesia with the patient or authorized representative who has indicated his/her understanding and acceptance.     Dental advisory given  Plan Discussed with: CRNA  Anesthesia Plan Comments:        Anesthesia Quick Evaluation

## 2023-07-30 NOTE — Discharge Instructions (Signed)
  Charles Benfield, M.D. Hand Surgery  POST-OPERATIVE DISCHARGE INSTRUCTIONS   PRESCRIPTIONS: You may have been given a prescription to be taken as directed for post-operative pain control.  You may also take over the counter ibuprofen/aleve and tylenol for pain. Take this as directed on the packaging. Do not exceed 3000 mg tylenol/acetaminophen in 24 hours.  Ibuprofen 600-800 mg (3-4) tablets by mouth every 6 hours as needed for pain.  OR Aleve 2 tablets by mouth every 12 hours (twice daily) as needed for pain.  AND/OR Tylenol 1000 mg (2 tablets) every 8 hours as needed for pain.  Please use your pain medication carefully, as refills are limited and you may not be provided with one.  As stated above, please use over the counter pain medicine - it will also be helpful with decreasing your swelling.    ANESTHESIA: After your surgery, post-surgical discomfort or pain is likely. This discomfort can last several days to a few weeks. At certain times of the day your discomfort may be more intense.   Did you receive a nerve block?  A nerve block can provide pain relief for one hour to two days after your surgery. As long as the nerve block is working, you will experience little or no sensation in the area the surgeon operated on.  As the nerve block wears off, you will begin to experience pain or discomfort. It is very important that you begin taking your prescribed pain medication before the nerve block fully wears off. Treating your pain at the first sign of the block wearing off will ensure your pain is better controlled and more tolerable when full-sensation returns. Do not wait until the pain is intolerable, as the medicine will be less effective. It is better to treat pain in advance than to try and catch up.   General Anesthesia:  If you did not receive a nerve block during your surgery, you will need to start taking your pain medication shortly after your surgery and should continue  to do so as prescribed by your surgeon.     ICE AND ELEVATION: You may use ice for the first 48-72 hours, but it is not critical.   Motion of your fingers is very important to decrease the swelling.  Elevation, as much as possible for the next 48 hours, is critical for decreasing swelling as well as for pain relief. Elevation means when you are seated or lying down, you hand should be at or above your heart. When walking, the hand needs to be at or above the level of your elbow.  If the bandage gets too tight, it may need to be loosened. Please contact our office and we will instruct you in how to do this.    SURGICAL BANDAGES:  Keep your dressing and/or splint clean and dry at all times.  Do not remove until you are seen again in the office.  If careful, you may place a plastic bag over your bandage and tape the end to shower, but be careful, do not get your bandages wet.     HAND THERAPY:  You may not need any. If you do, we will begin this at your follow up visit in the clinic.    ACTIVITY AND WORK: You are encouraged to move any fingers which are not in the bandage.  Light use of the fingers is allowed to assist the other hand with daily hygiene and eating, but strong gripping or lifting is often uncomfortable and   should be avoided.  You might miss a variable period of time from work and hopefully this issue has been discussed prior to surgery. You may not do any heavy work with your affected hand for about 2 weeks.    [facility] Second Floor, [address] [place], [place] [address] [phone]  

## 2023-07-31 ENCOUNTER — Encounter (HOSPITAL_COMMUNITY): Payer: Self-pay | Admitting: Orthopedic Surgery

## 2023-07-31 NOTE — Op Note (Addendum)
Date of Surgery: 07/30/2023  INDICATIONS: Patient is a 37 y.o.-year-old male with complex injuries to the right index, middle, and ring fingers after sticking his hand into a running lawn mower to try to remove an obstructions.  He was seen in the office and found to have traumatic amputation of middle finger through distal phalanx, open fracture of the ring finger  middle phalanx with volar oblique amputation through finger pad, and laceration through radial aspect of index finger tip.  Risks, benefits, and alternatives to surgery were again discussed with the patient in the preoperative area. The patient wishes to proceed with surgery.  Informed consent was signed after our discussion.   PREOPERATIVE DIAGNOSIS:  Amputation of right middle finger through middle phalanx Open fracture of ring finger middle phalanx Volar oblique amputation of ring finger tip Open fracture of index finger distal phalanx  POSTOPERATIVE DIAGNOSIS: Same.  PROCEDURE:   Irrigation and debridement of open right index distal phalanx fracture (11012) Irrigation and debridement of open right ring middle phalanx fracture (16109) Open treatment of right index finger distal phalanx fracture (60454) Open treatment of right ring finger middle phalanx fracture (09811) Revision amputation of right middle finger with soft tissue rearrangement (91478) Application of full-thickness skin graft to volar aspect of right ring finger, 2 cm x 1.5 cm (29562)   SURGEON: Waylan Rocher, M.D.  ASSIST: None  ANESTHESIA:  general  IV FLUIDS AND URINE: See anesthesia.  ESTIMATED BLOOD LOSS: <10 mL.  IMPLANTS: * No implants in log *   DRAINS: None  COMPLICATIONS: None  DESCRIPTION OF PROCEDURE: The patient was met in the preoperative holding area where the surgical site was marked and the consent form was signed.  The patient was then taken to the operating room and transferred to the operating table.  All bony prominences  were well padded.  A tourniquet was applied to the right upper arm.  General endotracheal anesthesia was induced.  The operative extremity was prepped and draped in the usual and sterile fashion.  A formal time-out was performed to confirm that this was the correct patient, surgery, side, and site.   Following formal timeout, the limb was gently exsanguinated with an Esmarch bandage and the tourniquet inflated to 250 mm of mercury.  I began by inspecting the wounds.  At the index fingertip, there was an oblique laceration through the dorsal radial aspect of the fingertip.  There was a sharp laceration through the base of the distal phalanx.  The ulnar half of the extensor apparatus appeared to be intact.  I then explored the middle finger.  There is a traumatic amputation through the middle phalanx of the finger.  There was a very complex, stellate laceration through the soft tissue from the distal aspect of the middle phalanx to the tip.  The nail apparatus was missing.  There was abundant contamination of the wound with grass clippings and dirt.  I then explored the ring finger.  There was a complex laceration of the ring finger.  With the fingertip, there was a volar oblique laceration to the pad of the finger.  There is a very small amount of distal phalanx exposed.  This laceration extended along the radial aspect of the finger at the level of the middle phalanx and extended dorsally with a transverse laceration of the dorsal aspect of the middle phalanx.  There was again abundant dirt, grass, and other debris within the wound bed.  There was a complex, comminuted fracture of  the middle phalanx.  It appeared that the radial neurovascular bundle was intact.  The ulnar vascular limb did not appear to be involved in the traumatic injury.  Following complete inspection of the wounds, I began by debridement.  The soft tissue of the index, middle, and ring finger was thoroughly debrided using a combination of  knife, tenotomy scissor, and curette.  This was an excisional debridement of skin, subcutaneous tissue, and bone.  At the ring finger, the ends of the fracture were delivered and cleaned using a small house curette.  The index finger fracture was debrided using a small house curette.  All of the wounds were thoroughly irrigated with copious sterile saline via low flow cystoscopy tubing.  At least 6 L of fluid was irrigated through the open wounds.  Following thorough irrigation and debridement I began with the index finger.  The soft tissue laceration was closed using a 4-0 Vicryl Rapide suture in simple interrupted fashion.  I then turned to the middle finger.  The distal phalanx was essentially missing and there was a complex fracture through the distal aspect of the middle phalanx.  I do not believe that a reconstruction of the fingertip was possible given this missing bone.  I therefore shortened the middle finger is much as necessary to obtain a primary closure.  The radial ulnar vascular wounds were identified.  Traction neurectomies were performed.  A radially based flap of skin was then rotated into place and used to cover the fingertip.  I used a 4-0 Vicryl Rapide suture for this.  I then turned to the ring finger.  The soft tissue was closed using a 4-0 Vicryl Rapide suture in similar fashion.  At the dorsal aspect of the finger on the middle phalanx, I performed a tenodermodesis to try to get the extensor apparatus to heal with the skin.  Following wound closure, I turned to fracture fixation.  At the index finger, a 0.045" K wire was driven in a retrograde fashion down the distal phalanx, across the fracture site, and into the subchondral bone at the base of the middle phalanx.  This K wire was cut beneath the skin.  I then turned to the ring finger.  I began with a 0.045 inch K wire down the axis of the distal phalanx, across the DIP joint, and across the severely comminuted fracture of the middle  phalanx.  Fracture stability I had a second 0.035 inch K wire from the distal and radial corner of the middle phalangeal head, across the fracture, and into the subchondral bone of the middle phalanx.  Both of these wires were cut beneath the skin.  I then worked on the volar oblique laceration of the ring finger tip.  This was not able to close primarily as there was nothing of missing skin from the pad of the finger.  I therefore like to do cover this wound using a full-thickness skin graft from the hypothenar eminence.  An outline of the defect was designed at the ulnar aspect of the hand over the thenar eminence.  The proximal distal end of this template was extended in a curvilinear fashion to allow for a cosmetic closure.  The skin was incised.  A full-thickness skin graft was taken.  Hemostasis was achieved with bipolar cautery.  All of the fat was removed from the undersurface of the graft.  The graft was then inset.  I used a 5-0 Vicryl Rapide suture in simple interrupted fashion.  I left  the ends of the suture along at the corners of the graft.  I would use these to tire a bolster.  The graft was sutured in place circumferentially.  I then take a sterile cottonball that was wrapped in Xeroform gauze.  I placed this over the skin graft and tied the long ends of the suture over to act as a bolster.  I then irrigated the donor site and closed primarily using a 4-0 Vicryl repeat suture.  At this point, the hand was cleaned.  A digital block of the index, middle, and ring finger was performed using 15 cc of 0.25% plain marcaine.  The wounds were dressed with Xeroform, folded 4 x 4's.  Sterile cast padding was applied.  The tourniquet was deflated.  A well-padded dorsal blocking splint was applied.  The patient was reversed from anesthesia and extubated uneventfully.  They were transferred from the operating table to the postoperative bed.  All counts were correct x 2 at the end of the procedure.  The patient  was then taken to the PACU in stable condition.   POSTOPERATIVE PLAN: He will be discharged to home with appropriate pain medication, antibiotics, and discharge instructions.  I'll see him back in the office early next week for a wound check.   Waylan Rocher, MD 7:31 AM

## 2023-07-31 NOTE — Anesthesia Postprocedure Evaluation (Signed)
Anesthesia Post Note  Patient: Alan Herrera.  Procedure(s) Performed: IRRIGATION AND DEBRIDEMENT MIDDLE AND RING FINGER WOUND (Right) IRRIGATION AND DEBRIDEMENT PERCUTANEOUS PINNING RIGHT RING FINGER MIDDLE PHALANX FRACTURE (Right: Finger) REVISION AMPUTATION OF MIDDLE FINGER WITH FULL THICKNESS SKIN GRAFTING TO RING FINGER (Right: Hand)     Patient location during evaluation: PACU Anesthesia Type: General Level of consciousness: awake and alert Pain management: pain level controlled Vital Signs Assessment: post-procedure vital signs reviewed and stable Respiratory status: spontaneous breathing, nonlabored ventilation, respiratory function stable and patient connected to nasal cannula oxygen Cardiovascular status: blood pressure returned to baseline and stable Postop Assessment: no apparent nausea or vomiting Anesthetic complications: no   No notable events documented.  Last Vitals:  Vitals:   07/30/23 1845 07/30/23 1900  BP: (!) 138/93 (!) 147/94  Pulse: 79 76  Resp: 15 15  Temp:  36.6 C  SpO2: 99% 97%    Last Pain:  Vitals:   07/30/23 1900  PainSc: 0-No pain                 Marilea Gwynne S

## 2023-09-24 DIAGNOSIS — M86149 Other acute osteomyelitis, unspecified hand: Secondary | ICD-10-CM | POA: Insufficient documentation

## 2023-09-25 ENCOUNTER — Encounter (HOSPITAL_BASED_OUTPATIENT_CLINIC_OR_DEPARTMENT_OTHER): Payer: Self-pay | Admitting: Orthopedic Surgery

## 2023-09-25 ENCOUNTER — Other Ambulatory Visit: Payer: Self-pay

## 2023-09-29 NOTE — Plan of Care (Signed)
[facility] Tonsillectomy/Adenoidectomy, Postoperative PEDS care plan entered in error.

## 2023-09-30 ENCOUNTER — Other Ambulatory Visit: Payer: Self-pay

## 2023-09-30 ENCOUNTER — Ambulatory Visit (HOSPITAL_BASED_OUTPATIENT_CLINIC_OR_DEPARTMENT_OTHER): Payer: MEDICAID | Admitting: Anesthesiology

## 2023-09-30 ENCOUNTER — Ambulatory Visit (HOSPITAL_BASED_OUTPATIENT_CLINIC_OR_DEPARTMENT_OTHER)
Admission: RE | Admit: 2023-09-30 | Discharge: 2023-09-30 | Disposition: A | Discharge status: Home / Self Care | Payer: MEDICAID | Attending: Orthopedic Surgery | Admitting: Orthopedic Surgery

## 2023-09-30 ENCOUNTER — Encounter (HOSPITAL_BASED_OUTPATIENT_CLINIC_OR_DEPARTMENT_OTHER): Payer: Self-pay | Admitting: Orthopedic Surgery

## 2023-09-30 ENCOUNTER — Encounter (HOSPITAL_BASED_OUTPATIENT_CLINIC_OR_DEPARTMENT_OTHER)
Admission: RE | Disposition: A | Discharge status: Home / Self Care | Payer: Self-pay | Source: Home / Self Care | Attending: Orthopedic Surgery

## 2023-09-30 DIAGNOSIS — M869 Osteomyelitis, unspecified: Secondary | ICD-10-CM | POA: Diagnosis present

## 2023-09-30 DIAGNOSIS — L03011 Cellulitis of right finger: Secondary | ICD-10-CM | POA: Diagnosis not present

## 2023-09-30 DIAGNOSIS — F17210 Nicotine dependence, cigarettes, uncomplicated: Secondary | ICD-10-CM | POA: Diagnosis not present

## 2023-09-30 DIAGNOSIS — Z1611 Resistance to penicillins: Secondary | ICD-10-CM | POA: Insufficient documentation

## 2023-09-30 DIAGNOSIS — B9689 Other specified bacterial agents as the cause of diseases classified elsewhere: Secondary | ICD-10-CM | POA: Diagnosis not present

## 2023-09-30 HISTORY — PX: AMPUTATION: SHX166

## 2023-09-30 HISTORY — PX: INCISION AND DRAINAGE OF WOUND: SHX1803

## 2023-09-30 SURGERY — IRRIGATION AND DEBRIDEMENT WOUND
Anesthesia: Monitor Anesthesia Care | Site: Finger | Laterality: Right

## 2023-09-30 MED ORDER — OXYCODONE HCL 5 MG PO TABS: 5.0000 mg | ORAL_TABLET | Freq: Once | ORAL | Status: DC

## 2023-09-30 MED ORDER — LIDOCAINE 2% (20 MG/ML) 5 ML SYRINGE
INTRAMUSCULAR | Status: AC
  Filled 2023-09-30: qty 5

## 2023-09-30 MED ORDER — SODIUM CHLORIDE 0.9 % IV SOLN
INTRAVENOUS | Status: DC | PRN
Start: 2023-09-30 — End: 2023-09-30

## 2023-09-30 MED ORDER — DOXYCYCLINE HYCLATE 100 MG PO CAPS: 100.0000 mg | ORAL_CAPSULE | Freq: Two times a day (BID) | ORAL | 0 refills | Status: DC

## 2023-09-30 MED ORDER — KETOROLAC TROMETHAMINE 30 MG/ML IJ SOLN
INTRAMUSCULAR | Status: AC
  Filled 2023-09-30: qty 1

## 2023-09-30 MED ORDER — FENTANYL CITRATE (PF) 100 MCG/2ML IJ SOLN
INTRAMUSCULAR | Status: DC | PRN
  Administered 2023-09-30 (×2): 50 ug via INTRAVENOUS

## 2023-09-30 MED ORDER — LACTATED RINGERS IV SOLN: INTRAVENOUS | Status: DC

## 2023-09-30 MED ORDER — ACETAMINOPHEN 500 MG PO TABS
ORAL_TABLET | ORAL | Status: AC
  Filled 2023-09-30: qty 2

## 2023-09-30 MED ORDER — PROPOFOL 10 MG/ML IV BOLUS
INTRAVENOUS | Status: DC | PRN
  Administered 2023-09-30: 30 mg via INTRAVENOUS

## 2023-09-30 MED ORDER — BUPIVACAINE HCL (PF) 0.25 % IJ SOLN
INTRAMUSCULAR | Status: DC | PRN
  Administered 2023-09-30: 10 mL

## 2023-09-30 MED ORDER — 0.9 % SODIUM CHLORIDE (POUR BTL) OPTIME
TOPICAL | Status: DC | PRN
  Administered 2023-09-30: 300 mL

## 2023-09-30 MED ORDER — PROPOFOL 500 MG/50ML IV EMUL
INTRAVENOUS | Status: DC | PRN
  Administered 2023-09-30: 100 ug/kg/min via INTRAVENOUS

## 2023-09-30 MED ORDER — ONDANSETRON HCL 4 MG/2ML IJ SOLN
INTRAMUSCULAR | Status: AC
  Filled 2023-09-30: qty 2

## 2023-09-30 MED ORDER — LIDOCAINE HCL (CARDIAC) PF 100 MG/5ML IV SOSY
PREFILLED_SYRINGE | INTRAVENOUS | Status: DC | PRN
  Administered 2023-09-30: 50 mg via INTRAVENOUS

## 2023-09-30 MED ORDER — LEVOFLOXACIN 750 MG PO TABS: 750.0000 mg | ORAL_TABLET | Freq: Every day | ORAL | 0 refills | Status: DC

## 2023-09-30 MED ORDER — CEFAZOLIN SODIUM-DEXTROSE 2-4 GM/100ML-% IV SOLN
2.0000 g | INTRAVENOUS | Status: AC
  Administered 2023-09-30: 2 g via INTRAVENOUS

## 2023-09-30 MED ORDER — OXYCODONE HCL 5 MG PO TABS
ORAL_TABLET | ORAL | Status: AC
  Filled 2023-09-30: qty 1

## 2023-09-30 MED ORDER — BACITRACIN ZINC 500 UNIT/GM EX OINT
TOPICAL_OINTMENT | CUTANEOUS | Status: AC
  Filled 2023-09-30: qty 28.35

## 2023-09-30 MED ORDER — OXYCODONE HCL 5 MG PO TABS: 5.0000 mg | ORAL_TABLET | ORAL | 0 refills | Status: AC | PRN

## 2023-09-30 MED ORDER — MIDAZOLAM HCL 2 MG/2ML IJ SOLN
INTRAMUSCULAR | Status: AC
  Filled 2023-09-30: qty 2

## 2023-09-30 MED ORDER — KETOROLAC TROMETHAMINE 30 MG/ML IJ SOLN
INTRAMUSCULAR | Status: DC | PRN
  Administered 2023-09-30: 30 mg via INTRAVENOUS

## 2023-09-30 MED ORDER — ACETAMINOPHEN 500 MG PO TABS
1000.0000 mg | ORAL_TABLET | Freq: Once | ORAL | Status: AC
  Administered 2023-09-30: 1000 mg via ORAL

## 2023-09-30 MED ORDER — DROPERIDOL 2.5 MG/ML IJ SOLN: 0.6250 mg | Freq: Once | INTRAMUSCULAR | Status: DC | PRN

## 2023-09-30 MED ORDER — FENTANYL CITRATE (PF) 100 MCG/2ML IJ SOLN: 25.0000 ug | INTRAMUSCULAR | Status: DC | PRN

## 2023-09-30 MED ORDER — ONDANSETRON HCL 4 MG/2ML IJ SOLN
INTRAMUSCULAR | Status: DC | PRN
  Administered 2023-09-30: 4 mg via INTRAVENOUS

## 2023-09-30 MED ORDER — FENTANYL CITRATE (PF) 100 MCG/2ML IJ SOLN
INTRAMUSCULAR | Status: AC
  Filled 2023-09-30: qty 2

## 2023-09-30 MED ORDER — BACITRACIN ZINC 500 UNIT/GM EX OINT
TOPICAL_OINTMENT | CUTANEOUS | Status: DC | PRN
  Administered 2023-09-30: 1 via TOPICAL

## 2023-09-30 MED ORDER — MIDAZOLAM HCL 5 MG/5ML IJ SOLN
INTRAMUSCULAR | Status: DC | PRN
  Administered 2023-09-30: 2 mg via INTRAVENOUS

## 2023-09-30 MED ORDER — CEFAZOLIN SODIUM-DEXTROSE 2-4 GM/100ML-% IV SOLN
INTRAVENOUS | Status: AC
  Filled 2023-09-30: qty 100

## 2023-09-30 SURGICAL SUPPLY — 48 items
APL PRP STRL LF DISP 70% ISPRP (MISCELLANEOUS) ×1
BANDAGE GAUZE 1X75IN STRL (MISCELLANEOUS) IMPLANT
BLADE AVERAGE 25X9 (BLADE) IMPLANT
BLADE OSC/SAG .038X5.5 CUT EDG (BLADE) IMPLANT
BLADE SURG 15 STRL LF DISP TIS (BLADE) ×1 IMPLANT
BLADE SURG 15 STRL SS (BLADE) ×2
BNDG CMPR 5X3 KNIT ELC UNQ LF (GAUZE/BANDAGES/DRESSINGS)
BNDG CMPR 75X11 PLY HI ABS (MISCELLANEOUS) ×1
BNDG CMPR 9X4 STRL LF SNTH (GAUZE/BANDAGES/DRESSINGS)
BNDG COHESIVE 1X5 TAN STRL LF (GAUZE/BANDAGES/DRESSINGS) IMPLANT
BNDG ELASTIC 3INX 5YD STR LF (GAUZE/BANDAGES/DRESSINGS) IMPLANT
BNDG ESMARK 4X9 LF (GAUZE/BANDAGES/DRESSINGS) IMPLANT
BNDG GAUZE 1X75IN STRL (MISCELLANEOUS) ×1
BNDG GAUZE DERMACEA FLUFF 4 (GAUZE/BANDAGES/DRESSINGS) IMPLANT
BNDG GZE DERMACEA 4 6PLY (GAUZE/BANDAGES/DRESSINGS)
CHLORAPREP W/TINT 26 (MISCELLANEOUS) IMPLANT
CORD BIPOLAR FORCEPS 12FT (ELECTRODE) ×1 IMPLANT
COVER BACK TABLE 60X90IN (DRAPES) ×1 IMPLANT
CUFF TOURN SGL QUICK 18X4 (TOURNIQUET CUFF) IMPLANT
CUFF TOURN SGL QUICK 24 (TOURNIQUET CUFF)
CUFF TRNQT CYL 24X4X16.5-23 (TOURNIQUET CUFF) IMPLANT
DRAIN PENROSE 12X.25 LTX STRL (MISCELLANEOUS) IMPLANT
DRAPE EXTREMITY T 121X128X90 (DISPOSABLE) ×1 IMPLANT
DRAPE OEC MINIVIEW 54X84 (DRAPES) IMPLANT
DRAPE SURG 17X23 STRL (DRAPES) ×1 IMPLANT
GAUZE SPONGE 4X4 12PLY STRL (GAUZE/BANDAGES/DRESSINGS) ×1 IMPLANT
GAUZE XEROFORM 1X8 LF (GAUZE/BANDAGES/DRESSINGS) ×1 IMPLANT
GLOVE BIO SURGEON STRL SZ7 (GLOVE) ×1 IMPLANT
GLOVE BIOGEL PI IND STRL 7.0 (GLOVE) ×1 IMPLANT
GOWN STRL REUS W/ TWL LRG LVL3 (GOWN DISPOSABLE) ×2 IMPLANT
GOWN STRL REUS W/TWL LRG LVL3 (GOWN DISPOSABLE) ×2
NDL HYPO 25X1 1.5 SAFETY (NEEDLE) IMPLANT
NEEDLE HYPO 25X1 1.5 SAFETY (NEEDLE) ×1
NS IRRIG 1000ML POUR BTL (IV SOLUTION) ×1 IMPLANT
PACK BASIN DAY SURGERY FS (CUSTOM PROCEDURE TRAY) ×1 IMPLANT
PAD CAST 3X4 CTTN HI CHSV (CAST SUPPLIES) ×1 IMPLANT
PADDING CAST COTTON 3X4 STRL (CAST SUPPLIES)
SHEET MEDIUM DRAPE 40X70 STRL (DRAPES) ×1 IMPLANT
SLEEVE SCD COMPRESS KNEE MED (STOCKING) IMPLANT
SUT ETHILON 4 0 PS 2 18 (SUTURE) ×1 IMPLANT
SUT MNCRL AB 3-0 PS2 18 (SUTURE) IMPLANT
SUT VIC AB 4-0 PS2 18 (SUTURE) IMPLANT
SUT VICRYL RAPIDE 4/0 PS 2 (SUTURE) ×1 IMPLANT
SYR BULB EAR ULCER 3OZ GRN STR (SYRINGE) ×1 IMPLANT
SYR CONTROL 10ML LL (SYRINGE) IMPLANT
TOWEL GREEN STERILE FF (TOWEL DISPOSABLE) ×2 IMPLANT
TRAY DSU PREP LF (CUSTOM PROCEDURE TRAY) IMPLANT
UNDERPAD 30X36 HEAVY ABSORB (UNDERPADS AND DIAPERS) ×1 IMPLANT

## 2023-09-30 NOTE — Anesthesia Preprocedure Evaluation (Signed)
Anesthesia Evaluation  Patient identified by MRN, date of birth, ID band Patient awake    Reviewed: Allergy & Precautions, NPO status , Patient's Chart, lab work & pertinent test results  Airway Mallampati: II  TM Distance: >3 FB Neck ROM: Full    Dental  (+) Dental Advisory Given   Pulmonary Current Smoker   breath sounds clear to auscultation       Cardiovascular negative cardio ROS  Rhythm:Regular Rate:Normal     Neuro/Psych negative neurological ROS     GI/Hepatic negative GI ROS, Neg liver ROS,,,  Endo/Other  negative endocrine ROS    Renal/GU negative Renal ROS     Musculoskeletal   Abdominal   Peds  Hematology negative hematology ROS (+)   Anesthesia Other Findings   Reproductive/Obstetrics                             Anesthesia Physical Anesthesia Plan  ASA: 2  Anesthesia Plan: MAC   Post-op Pain Management: Tylenol PO (pre-op)*   Induction:   PONV Risk Score and Plan: 0 and Propofol infusion and Ondansetron  Airway Management Planned: Natural Airway and Simple Face Mask  Additional Equipment:   Intra-op Plan:   Post-operative Plan:   Informed Consent: I have reviewed the patients History and Physical, chart, labs and discussed the procedure including the risks, benefits and alternatives for the proposed anesthesia with the patient or authorized representative who has indicated his/her understanding and acceptance.       Plan Discussed with: CRNA  Anesthesia Plan Comments:        Anesthesia Quick Evaluation

## 2023-09-30 NOTE — Transfer of Care (Signed)
Immediate Anesthesia Transfer of Care Note  Patient: Alan Herrera.  Procedure(s) Performed: IRRIGATION AND DEBRIDEMENT OF RIGHT INDEX FINGER TIP (Right: Finger) AMPUTATION OF RIGHT RING FINGER (Right: Finger)  Patient Location: PACU  Anesthesia Type:MAC  Level of Consciousness: awake, alert , and oriented  Airway & Oxygen Therapy: Patient Spontanous Breathing and Patient connected to face mask oxygen  Post-op Assessment: Report given to RN and Post -op Vital signs reviewed and stable  Post vital signs: Reviewed and stable  Last Vitals:  Vitals Value Taken Time  BP 104/47 09/30/23 1210  Temp    Pulse 53 09/30/23 1210  Resp 14 09/30/23 1210  SpO2 99 % 09/30/23 1210    Last Pain:  Vitals:   09/30/23 0942  TempSrc: Oral  PainSc: 2       Patients Stated Pain Goal: 2 (09/30/23 0942)  Complications: No notable events documented.

## 2023-09-30 NOTE — H&P (Signed)
HAND SURGERY   HPI: Patient is a 37 y.o. 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  Patient had a significant injury to the right hand approximately 2 months ago in which his hand was caught in the blades of a running lawnmower.  The wounds were quite contaminated.  He underwent thorough irrigation and debridement with percutaneous pinning and a skin graft application to the tip of the ring finger.  Unfortunately, he has developed what appears to be osteomyelitis of the ring finger and potentially the index fingertip on the MRI.  There has been significant osteolysis of the middle phalanx over the course of the last month or so.  He presents today for amputation of the right ring finger for source control.  Patient denies any changes to their medical history or new systemic symptoms today.    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   Pneumonia    x 1   Past Surgical History:  Procedure Laterality Date   AMPUTATION Right 07/30/2023   Procedure: REVISION AMPUTATION OF MIDDLE FINGER WITH FULL THICKNESS SKIN GRAFTING TO RING FINGER;  Surgeon: Marlyne Beards, MD;  Location: [facility] OR;  Service: Orthopedics;  Laterality: Right;   CLOSED REDUCTION FINGER WITH PERCUTANEOUS PINNING Right 07/30/2023   Procedure: IRRIGATION AND DEBRIDEMENT PERCUTANEOUS PINNING RIGHT RING FINGER MIDDLE PHALANX FRACTURE;  Surgeon: Marlyne Beards, MD;  Location: [facility] OR;  Service: Orthopedics;  Laterality: Right;   INCISION AND DRAINAGE OF WOUND Right 07/30/2023   Procedure: IRRIGATION AND DEBRIDEMENT MIDDLE AND RING FINGER WOUND;  Surgeon: Marlyne Beards, MD;  Location: [facility] OR;  Service: Orthopedics;  Laterality: Right;   Social History   Socioeconomic History    Marital status: Single    Spouse name: Not on file   Number of children: Not on file   Years of education: Not on file   Highest education level: Not on file  Occupational History   Not on file  Tobacco Use   Smoking status: Every Day    Current packs/day: 1.00    Average packs/day: 1 pack/day for 20.8 years (20.8 ttl pk-yrs)    Types: Cigarettes    Start date: 12/08/2002   Smokeless tobacco: Never  Vaping Use   Vaping status: Never Used  Substance and Sexual Activity   Alcohol use: Yes    Comment: drinks 3-4 x per week - 3-4 drinks wine/beer   Drug use: Yes    Types: Marijuana    Comment: used 09/28/23   Sexual activity: Yes  Other Topics Concern   Not on file  Social History Narrative   ** Merged History Encounter **       Social Determinants of Health   Financial Resource Strain: Not on file  Food Insecurity: Not on file  Transportation Needs: Not on file  Physical Activity: Not on file  Stress: Not on file  Social Connections: Not on file   History reviewed. No pertinent family history. - negative except otherwise stated in the family history section No Known Allergies Prior to Admission medications   Medication Sig Start Date End Date Taking? Authorizing Provider  acetaminophen (TYLENOL) 500 MG tablet Take 500 mg by mouth every 6 (six) hours as  needed.   Yes [provider]  ibuprofen (ADVIL) 200 MG tablet Take 200 mg by mouth every 6 (six) hours as needed.   Yes [provider]   No results found. - Positive ROS: All other systems have been reviewed and were otherwise negative with the exception of those mentioned in the HPI and as above.  Physical Exam: General: No acute distress, resting comfortably Cardiovascular: BUE warm and well perfused, normal rate Respiratory: Normal WOB on RA Skin: Warm and dry Neurologic: Sensation intact distally Psychiatric: Patient is at baseline mood and affect  Right upper Extremity  Persistent swelling  of the right index fingertip around the proximal nail fold with mild erythema but no drainage.  There is significant swelling of the ring finger distal to the traumatic laceration.  There is gross motion at the fracture site without any evidence of interval healing.  This finger is quite painful with manipulation or attempted range of motion.  He has diminished sensation at the radial and ulnar borders of his ring finger.  The middle finger amputation site is clean, dry, and appears well-healed.  The thumb and small finger are uninvolved.  Assessment: This is a 37 year old male with complex history involving the right hand with severe, contaminated open fractures.  Unfortunately, he has developed osteomyelitis of this ring finger middle phalanx with continued osteolysis and edema on MRI concerning for osteomyelitis.  He presents today for amputation of this right ring finger through the base of the middle phalanx or PIP joint for source control.  Plan: OR today for amputation of the right ring finger through the base of the middle phalanx or through the PIP joint for source control for osteomyelitis. We again reviewed the risks of surgery which include bleeding, infection, damage to neurovascular structures, persistent symptoms, persistent infection, wound healing, need for additional surgery.  Informed consent was signed.  All questions were answered.   Marlyne Beards, M.D. [facility] 10:56 AM

## 2023-09-30 NOTE — Progress Notes (Signed)
Id brief note  Outpatient question regarding management of om   Patient with open fx due to lawn mower injury  S/p amputation 3rd finger Admitted 10/23 for ring finger amputation and ?debridement index finger distal phlanx   Given open fx concern for potential ntm/afb infection as well   -please send bacterial, fungal, and AFB culture from bone -agree can start doxy/levo empiric om for 6 weeks -id clinic new visit    Clinic Follow Up Appt: 10/31 @ 2pm with dr Drue Second  @  [facility] [address], [place], [place] [address] Phone: [phone]

## 2023-09-30 NOTE — Discharge Instructions (Addendum)
Alan Herrera, M.D. Hand Surgery  POST-OPERATIVE DISCHARGE INSTRUCTIONS   PRESCRIPTIONS: You may have been given a prescription to be taken as directed for post-operative pain control.  You may also take over the counter ibuprofen/aleve and tylenol for pain. Take this as directed on the packaging. Do not exceed 3000 mg tylenol/acetaminophen in 24 hours.  Ibuprofen 600-800 mg (3-4) tablets by mouth every 6 hours as needed for pain.  OR Aleve 2 tablets by mouth every 12 hours (twice daily) as needed for pain.  AND/OR Tylenol 1000 mg (2 tablets) every 8 hours as needed for pain.  Please use your pain medication carefully, as refills are limited and you may not be provided with one.  As stated above, please use over the counter pain medicine - it will also be helpful with decreasing your swelling.    ANESTHESIA: After your surgery, post-surgical discomfort or pain is likely. This discomfort can last several days to a few weeks. At certain times of the day your discomfort may be more intense.   Did you receive a nerve block?  A nerve block can provide pain relief for one hour to two days after your surgery. As long as the nerve block is working, you will experience little or no sensation in the area the surgeon operated on.  As the nerve block wears off, you will begin to experience pain or discomfort. It is very important that you begin taking your prescribed pain medication before the nerve block fully wears off. Treating your pain at the first sign of the block wearing off will ensure your pain is better controlled and more tolerable when full-sensation returns. Do not wait until the pain is intolerable, as the medicine will be less effective. It is better to treat pain in advance than to try and catch up.   General Anesthesia:  If you did not receive a nerve block during your surgery, you will need to start taking your pain medication shortly after your surgery and should continue  to do so as prescribed by your surgeon.     ICE AND ELEVATION: You may use ice for the first 48-72 hours, but it is not critical.   Motion of your fingers is very important to decrease the swelling.  Elevation, as much as possible for the next 48 hours, is critical for decreasing swelling as well as for pain relief. Elevation means when you are seated or lying down, you hand should be at or above your heart. When walking, the hand needs to be at or above the level of your elbow.  If the bandage gets too tight, it may need to be loosened. Please contact our office and we will instruct you in how to do this.    SURGICAL BANDAGES:  Keep your dressing and/or splint clean and dry at all times.  Do not remove until you are seen again in the office.  If careful, you may place a plastic bag over your bandage and tape the end to shower, but be careful, do not get your bandages wet.     HAND THERAPY:  You may not need any. If you do, we will begin this at your follow up visit in the clinic.    ACTIVITY AND WORK: You are encouraged to move any fingers which are not in the bandage.  Light use of the fingers is allowed to assist the other hand with daily hygiene and eating, but strong gripping or lifting is often uncomfortable and  should be avoided.  You might miss a variable period of time from work and hopefully this issue has been discussed prior to surgery. You may not do any heavy work with your affected hand for about 2 weeks.    [facility] Second Floor, [address] [place], [place] [address] [phone]    Post Anesthesia Home Care Instructions  Activity: Get plenty of rest for the remainder of the day. A responsible individual must stay with you for 24 hours following the procedure.  For the next 24 hours, DO NOT: -Drive a car -Advertising copywriter -Drink alcoholic beverages -Take any medication unless instructed by your physician -Make any legal decisions or sign  important papers.  Meals: Start with liquid foods such as gelatin or soup. Progress to regular foods as tolerated. Avoid greasy, spicy, heavy foods. If nausea and/or vomiting occur, drink only clear liquids until the nausea and/or vomiting subsides. Call your physician if vomiting continues.  Special Instructions/Symptoms: Your throat may feel dry or sore from the anesthesia or the breathing tube placed in your throat during surgery. If this causes discomfort, gargle with warm salt water. The discomfort should disappear within 24 hours.  Can take tylenol after 4:10 pm if needed Can take ibuprofen after 7:50 pm if needed

## 2023-09-30 NOTE — Op Note (Signed)
Date of Surgery: 09/30/2023  INDICATIONS: Patient is a 37 y.o.-year-old male who initially presented multiple, significant open fractures to the right hand after his hand was caught in the running blade of a lawnmower.  He underwent irrigation debridement of an open fracture of the index distal phalanx and ring finger middle phalanx.  The middle finger was nonsalvageable at that time and he underwent amputation.  Unfortunately, he has developed osteomyelitis involving the ring finger middle phalanx as diagnosed on both plain x-ray and MRI.  She also has some evidence of osteomyelitis at the index finger distal phalanx, however, there is a much less degree of osteolysis as compared to the ring finger.  He presents today for amputation of the right ring finger through the middle phalanx or PIP joint for source control and irrigation and debridement of what appears to be a paronychia of the index finger.  Risks, benefits, and alternatives to surgery were again discussed with the patient in the preoperative area. The patient wishes to proceed with surgery.  Informed consent was signed after our discussion.   PREOPERATIVE DIAGNOSIS:  1.  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  Right index finger acute distal phalangeal osteomyelitis and paronychia.  POSTOPERATIVE DIAGNOSIS: Same.  PROCEDURE:  Amputation of the right ring finger through the PIP joint for source control Irrigation and debridement of right index finger proximal nail fold   SURGEON: Waylan Rocher, M.D.  ASSIST: None  ANESTHESIA:  Local, MAC  IV FLUIDS AND URINE: See anesthesia.  ESTIMATED BLOOD LOSS: <5 mL.  IMPLANTS: * No implants in log *   DRAINS: None  COMPLICATIONS: None  DESCRIPTION OF PROCEDURE: The patient was met in the preoperative holding area where the surgical site was marked and the informed consent form was signed.  The patient was then brought back to the operating room  and remained on the stretcher.  A hand table was placed adjacent to the operative extremity and locked into place.  A formal timeout was performed to confirm that this was the correct patient, surgical side, surgical site, and surgical procedure.  All were present and in agreement. Following formal timeout, a local block was performed using 10 mL of 0.25% plain marcaine.  The right upper extremity was then prepped and draped in the usual and sterile fashion.   I began with the ring finger.  Following a second formal timeout, 1/4 inch Penrose drain was wrapped around the base of the ring finger, pulled taut, and clamped with a hemostat.  A fishmouth type incision was designed over the base of the middle phalanx.  The skin was incised.  The extensor apparatus was divided as well as the flexor apparatus.  There was no evidence of healing at the middle phalangeal fracture.  The bone of the entire middle phalanx was quite soft and friable.  I did not feel that the base of the middle phalanx would be viable and I was concerned for residual infection.  I therefore decided to proceed with an amputation through the PIP joint.  The finger was disarticulated.  The cartilage from the head of the proximal phalanx was debrided using a rongeurs to allow for a smooth contour.  Tenotomy scissors were used to identify the radial and ulnar neurovascular bundles.  The digital arteries were coagulated with bipolar cautery.  The digital nerves were identified and traction neurectomies were performed.  I removed some of the bone from the middle phalanx to be sent for  anaerobic, aerobic, fungal, and AFB cultures.  I similarly removed some of the slimy, nonviable appearing soft tissue over the middle phalanx for culture.  At this point, the ring finger wound was thoroughly irrigated with copious sterile saline.  It was closed in a simple fashion using a 4-0 Vicryl Rapide suture.  I then turned my attention to the index finger.  There is  no drainage from the area on the proximal nail fold, however, there was diffuse swelling around the proximal nail fold with mild erythema.  I made a longitudinal incision in line with the radial nail fold.  There was no purulence encountered.  This wound was then irrigated and closed using a single 4-0 Vicryl Rapide suture.  The tourniquet was removed.  The ring finger wound was dressed with Xeroform, bacitracin ointment, 4 x 4's, Kling wrap.  The index finger wound was dressed with Xeroform, 4 x 4's, Kling wrap.  Both both fingers were then dressed with a loose 1 inch Coban.  The patient was reversed from sedation.  All counts were correct x 2 at the end of the procedure.  The patient was then taken to the PACU in stable condition.   POSTOPERATIVE PLAN: We discharged home with appropriate discharge instructions.  I have consulted with infectious disease who recommend oral doxycycline and levofloxacin for 6 weeks.  I will see him back in the office in 10 to 14 days for a wound check.  Waylan Rocher, MD 12:08 PM

## 2023-09-30 NOTE — Anesthesia Postprocedure Evaluation (Signed)
Anesthesia Post Note  Patient: Alan Herrera.  Procedure(s) Performed: IRRIGATION AND DEBRIDEMENT OF RIGHT INDEX FINGER TIP (Right: Finger) AMPUTATION OF RIGHT RING FINGER (Right: Finger)     Patient location during evaluation: PACU Anesthesia Type: MAC Level of consciousness: awake and alert Pain management: pain level controlled Vital Signs Assessment: post-procedure vital signs reviewed and stable Respiratory status: spontaneous breathing, nonlabored ventilation, respiratory function stable and patient connected to nasal cannula oxygen Cardiovascular status: stable and blood pressure returned to baseline Postop Assessment: no apparent nausea or vomiting Anesthetic complications: no  No notable events documented.  Last Vitals:  Vitals:   09/30/23 1235 09/30/23 1255  BP:  126/88  Pulse: (!) 44 64  Resp: 15 18  Temp:  36.5 C  SpO2: 99% 99%    Last Pain:  Vitals:   09/30/23 1255  TempSrc: Oral  PainSc: 2                  Kennieth Rad

## 2023-10-01 ENCOUNTER — Encounter (HOSPITAL_BASED_OUTPATIENT_CLINIC_OR_DEPARTMENT_OTHER): Payer: Self-pay | Admitting: Orthopedic Surgery

## 2023-10-02 LAB — ACID FAST SMEAR (AFB, MYCOBACTERIA): Acid Fast Smear: NEGATIVE

## 2023-10-03 LAB — ACID FAST SMEAR (AFB, MYCOBACTERIA): Acid Fast Smear: NEGATIVE

## 2023-10-05 LAB — AEROBIC/ANAEROBIC CULTURE W GRAM STAIN (SURGICAL/DEEP WOUND): Gram Stain: NONE SEEN

## 2023-10-08 ENCOUNTER — Ambulatory Visit: Payer: MEDICAID | Admitting: Internal Medicine

## 2023-10-14 ENCOUNTER — Encounter: Payer: Self-pay | Admitting: Internal Medicine

## 2023-10-14 ENCOUNTER — Ambulatory Visit (INDEPENDENT_AMBULATORY_CARE_PROVIDER_SITE_OTHER): Payer: MEDICAID | Admitting: Internal Medicine

## 2023-10-14 ENCOUNTER — Other Ambulatory Visit: Payer: Self-pay

## 2023-10-14 VITALS — BP 119/88 | HR 63 | Temp 98.0°F | Resp 16 | Wt 118.4 lb

## 2023-10-14 DIAGNOSIS — M869 Osteomyelitis, unspecified: Secondary | ICD-10-CM

## 2023-10-14 MED ORDER — SULFAMETHOXAZOLE-TRIMETHOPRIM 800-160 MG PO TABS: 2.0000 | ORAL_TABLET | Freq: Two times a day (BID) | ORAL | 0 refills | Status: DC

## 2023-10-14 NOTE — Progress Notes (Signed)
[facility]  There are no problems to display for this patient.     Subjective:    Patient ID: Alan Carol., male    DOB: 03-16-1986, 37 y.o.   MRN: [id number]  Chief Complaint  Patient presents with   Follow-up   Hospitalization Follow-up    HPI:  Alan Criado. is a 37 y.o. male here for new patient referral from orthopedics  He had open fracture due to lawn mower injury and underwent amputation 3rd finger 07/30/23 (right middle finger through middle phalanx). He also had open fx of ring finger middle phlanx and index distal phalanx but were amputated at this time  However due to concern for nonhealing wound/OM ring finger and index finger on both xray and mri done at ortho clinic dr Frazier Butt, patient was admitted 09/30/23 for I&D  Ntm/fungal cx negative Bacterial culture grew -- proteus vulgaris (S bactrim, cipro), serratia marcescens (S cipro/bactrim) and morgenella/citrobacter (S bactrim, cipro)   I reviewed operative note: PROCEDURE:  Amputation of the right ring finger through the PIP joint for source control Irrigation and debridement of right index finger proximal nail fold   He was started on doxy/levoflox plan for 6 weeks however only given 7 days so out of levo for 3 days    No Known Allergies    Outpatient Medications Prior to Visit  Medication Sig Dispense Refill   acetaminophen (TYLENOL) 500 MG tablet Take 500 mg by mouth every 6 (six) hours as needed.     doxycycline (VIBRAMYCIN) 100 MG capsule Take 1 capsule (100 mg total) by mouth 2 (two) times daily. 84 capsule 0   ibuprofen (ADVIL) 200 MG tablet Take 200 mg by mouth every 6 (six) hours as needed.     levofloxacin (LEVAQUIN) 750 MG tablet Take 1 tablet (750 mg total) by mouth daily. (Patient not taking: Reported on 10/14/2023) 7 tablet 0   No facility-administered medications prior to visit.     Social History   Socioeconomic History    Marital status: Single    Spouse name: Not on file   Number of children: Not on file   Years of education: Not on file   Highest education level: Not on file  Occupational History   Not on file  Tobacco Use   Smoking status: Every Day    Current packs/day: 1.00    Average packs/day: 1 pack/day for 20.8 years (20.8 ttl pk-yrs)    Types: Cigarettes    Start date: 12/08/2002   Smokeless tobacco: Never  Vaping Use   Vaping status: Never Used  Substance and Sexual Activity   Alcohol use: Yes    Comment: drinks 3-4 x per week - 3-4 drinks wine/beer   Drug use: Yes    Types: Marijuana    Comment: used 09/28/23   Sexual activity: Yes  Other Topics Concern   Not on file  Social History Narrative   ** Merged History Encounter **       Social Determinants of Health   Financial Resource Strain: Not on file  Food Insecurity: Not on file  Transportation Needs: Not on file  Physical Activity: Not on file  Stress: Not on file  Social Connections: Not on file  Intimate Partner Violence: Not on file      Review of Systems     Objective:    BP 119/88   Pulse 63   Temp 98 F (36.7 C) (  Temporal)   Resp 16   Wt 118 lb 6.4 oz (53.7 kg)   SpO2 97%   BMI 18.54 kg/m  Nursing note and vital signs reviewed.  Physical Exam     General/constitutional: no distress, pleasant HEENT: Normocephalic, PER, Conj Clear, EOMI, Oropharynx clear Neck supple CV: rrr no mrg Lungs: clear to auscultation, normal respiratory effort Abd: Soft, Nontender Ext: no edema Skin: No Rash Neuro: nonfocal MSK: no sign of cellulitis; minimal tenderness index finger and ringfinger surgical site        Labs: Lab Results  Component Value Date   WBC 10.0 07/27/2023   HGB 13.8 07/27/2023   HCT 40.1 07/27/2023   MCV 93.3 07/27/2023   PLT 181 07/27/2023   Last metabolic panel Lab Results  Component Value Date   GLUCOSE 119 (H) 07/27/2023   NA 139 07/27/2023   K 3.8 07/27/2023   CL 105  07/27/2023   CO2 25 07/27/2023   BUN 8 07/27/2023   CREATININE 1.01 07/27/2023   GFRNONAA >60 07/27/2023   CALCIUM 8.4 (L) 07/27/2023   PROT 6.4 (L) 07/27/2023   ALBUMIN 3.8 07/27/2023   BILITOT 0.9 07/27/2023   ALKPHOS 49 07/27/2023   AST 19 07/27/2023   ALT 14 07/27/2023   ANIONGAP 9 07/27/2023    Micro:  Serology:  Imaging:  Assessment & Plan:   Problem List Items Addressed This Visit   None Visit Diagnoses     Osteomyelitis, unspecified site, unspecified type (HCC)    -  Primary   Relevant Medications   sulfamethoxazole-trimethoprim (BACTRIM DS) 800-160 MG tablet   Other Relevant Orders   CBC   COMPLETE METABOLIC PANEL WITH GFR   C-reactive protein         No orders of the defined types were placed in this encounter.    Patient open fx of right hand index middle ringfinger S/p disarticulation distal pip joint middle finger 07/2023 no issue OM index tip and finger finger middle phalanx s/p disarticulation pip joint ring finger and I&D of the tip index finger   Cx amp-c organisms all sensitive to bactrim/levo  Do to rigorous heavy lifting required at work, will switch levofloxacin to bactrim  Plan 5 more weeks bactrim 2 ds bid  See me in 2 weeks to repeat labs  Labs today  All wounds/incision appeared healing no active sign of breakthrough infection      Follow-up: Return in about 2 weeks (around 10/28/2023).      Alan Band, MD [facility] 10/14/2023, 2:03 PM

## 2023-10-14 NOTE — Patient Instructions (Signed)
See me in 2 weeks to recheck labs   Take bactrim 2 tablet in morning and 2 tablets in evening 5 more weeks    Stop levofloxacin and doxycycline

## 2023-10-15 LAB — CBC
HCT: 45 % (ref 38.5–50.0)
Hemoglobin: 14.7 g/dL (ref 13.2–17.1)
MCH: 30.7 pg (ref 27.0–33.0)
MCHC: 32.7 g/dL (ref 32.0–36.0)
MCV: 93.9 fL (ref 80.0–100.0)
MPV: 10.7 fL (ref 7.5–12.5)
Platelets: 227 10*3/uL (ref 140–400)
RBC: 4.79 10*6/uL (ref 4.20–5.80)
RDW: 12.2 % (ref 11.0–15.0)
WBC: 9 10*3/uL (ref 3.8–10.8)

## 2023-10-15 LAB — COMPLETE METABOLIC PANEL WITH GFR
AG Ratio: 1.8 (calc) (ref 1.0–2.5)
ALT: 16 U/L (ref 9–46)
AST: 17 U/L (ref 10–40)
Albumin: 4.2 g/dL (ref 3.6–5.1)
Alkaline phosphatase (APISO): 69 U/L (ref 36–130)
BUN: 12 mg/dL (ref 7–25)
CO2: 28 mmol/L (ref 20–32)
Calcium: 8.9 mg/dL (ref 8.6–10.3)
Chloride: 102 mmol/L (ref 98–110)
Creat: 0.95 mg/dL (ref 0.60–1.26)
Globulin: 2.3 g/dL (ref 1.9–3.7)
Glucose, Bld: 82 mg/dL (ref 65–99)
Potassium: 4.2 mmol/L (ref 3.5–5.3)
Sodium: 138 mmol/L (ref 135–146)
Total Bilirubin: 0.5 mg/dL (ref 0.2–1.2)
Total Protein: 6.5 g/dL (ref 6.1–8.1)
eGFR: 106 mL/min/{1.73_m2} (ref >=60)

## 2023-10-15 LAB — C-REACTIVE PROTEIN: CRP: <3 mg/L (ref <8.0)

## 2023-10-28 ENCOUNTER — Encounter: Payer: Self-pay | Admitting: Infectious Disease

## 2023-10-28 ENCOUNTER — Other Ambulatory Visit: Payer: Self-pay

## 2023-10-28 ENCOUNTER — Ambulatory Visit (INDEPENDENT_AMBULATORY_CARE_PROVIDER_SITE_OTHER): Payer: MEDICAID | Admitting: Infectious Disease

## 2023-10-28 VITALS — BP 132/87 | HR 69 | Temp 98.0°F | Ht 66.0 in

## 2023-10-28 DIAGNOSIS — Z7185 Encounter for immunization safety counseling: Secondary | ICD-10-CM

## 2023-10-28 DIAGNOSIS — M869 Osteomyelitis, unspecified: Secondary | ICD-10-CM | POA: Diagnosis not present

## 2023-10-28 DIAGNOSIS — B181 Chronic viral hepatitis B without delta-agent: Secondary | ICD-10-CM | POA: Diagnosis not present

## 2023-10-28 DIAGNOSIS — Z114 Encounter for screening for human immunodeficiency virus [HIV]: Secondary | ICD-10-CM | POA: Diagnosis not present

## 2023-10-28 DIAGNOSIS — Z5320 Procedure and treatment not carried out because of patient's decision for unspecified reasons: Secondary | ICD-10-CM | POA: Diagnosis not present

## 2023-10-28 DIAGNOSIS — A499 Bacterial infection, unspecified: Secondary | ICD-10-CM

## 2023-10-28 DIAGNOSIS — Z1612 Extended spectrum beta lactamase (ESBL) resistance: Secondary | ICD-10-CM

## 2023-10-28 NOTE — Progress Notes (Signed)
Subjective:  Complaint follow-up for osteomyelitis involving multiple digits on the right hand   Patient ID: Alan Herrera., male    DOB: Jun 28, 1986, 37 y.o.   MRN: [id number]  HPI  Discussed the use of AI scribe software for clinical note transcription with the patient, who gave verbal consent to proceed.  History of Present Illness   37 y.o. male here for new patient referral from orthopedics   He had open fracture due to lawn mower injury and underwent amputation 3rd finger 07/30/23 (right middle finger through middle phalanx). He also had open fx of ring finger middle phlanx and index distal phalanx but were amputated at this time   However due to concern for nonhealing wound/OM ring finger and index finger on both xray and mri done at ortho clinic dr Frazier Butt, patient was admitted 09/30/23 for I&D     I reviewed operative note: PROCEDURE:  1. Amputation of the right ring finger through the PIP joint for source control 2. Irrigation and debridement of right index finger proximal nail fold     He was started on doxy/levoflox plan for 6 weeks however only given 7 days so out of levo for 3 days    Ntm/fungal cx negative Bacterial culture grew -- proteus vulgaris (S bactrim, cipro), serratia marcescens (S cipro/bactrim) and morgenella/citrobacter (S bactrim, cipro)   Patient open fx of right hand index middle ringfinger S/p disarticulation distal pip joint middle finger 07/2023 no issue OM index tip and finger finger middle phalanx s/p disarticulation pip joint ring finger and I&D of the tip index finger     Cx amp-c organisms all sensitive to bactrim/levo   Do to rigorous heavy lifting required at work, will switch levofloxacin to bactrim   Plan 5 more weeks bactrim 2 ds bid   Today he returns for followup with me having seen Dr. Renold Don at last several appointments and as an inpatient.    He relates a a history of  lawnmower accident which precipiated loss of the right  ring finger with osteomyelitis and shortening of the middle finger, presents with a nonhealing wound osteomyelitis of the right index finger per the patient. The patient reports that the index finger was "swelled up like a big old bulkhead" and was leaking pus around the nail, but has since improved significantly. They deny significant pain in the area. The patient is currently on Bactrim two double strength twice a day for the infection. They also have a ? history of hepatitis B in childhood, which they believe has been cleared.       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   Pneumonia    x 1    Past Surgical History:  Procedure Laterality Date   AMPUTATION Right 07/30/2023   Procedure: REVISION AMPUTATION OF MIDDLE FINGER WITH FULL THICKNESS SKIN GRAFTING TO RING FINGER;  Surgeon: Marlyne Beards, MD;  Location: [facility] OR;  Service: Orthopedics;  Laterality: Right;   AMPUTATION Right 09/30/2023   Procedure: AMPUTATION OF RIGHT RING FINGER;  Surgeon: Marlyne Beards, MD;  Location: [facility];  Service: Orthopedics;  Laterality: Right;  local  60   CLOSED REDUCTION FINGER WITH PERCUTANEOUS PINNING Right 07/30/2023   Procedure: IRRIGATION AND DEBRIDEMENT PERCUTANEOUS PINNING RIGHT RING FINGER MIDDLE PHALANX  FRACTURE;  Surgeon: Marlyne Beards, MD;  Location: [facility] OR;  Service: Orthopedics;  Laterality: Right;   INCISION AND DRAINAGE OF WOUND Right 07/30/2023   Procedure: IRRIGATION AND DEBRIDEMENT MIDDLE AND RING FINGER WOUND;  Surgeon: Marlyne Beards, MD;  Location: [facility] OR;  Service: Orthopedics;  Laterality: Right;   INCISION AND DRAINAGE OF WOUND Right 09/30/2023   Procedure: IRRIGATION AND DEBRIDEMENT OF RIGHT INDEX FINGER TIP;  Surgeon: Marlyne Beards, MD;  Location: [facility];  Service: Orthopedics;  Laterality:  Right;  local 60    No family history on file.    Social History   Socioeconomic History   Marital status: Single    Spouse name: Not on file   Number of children: Not on file   Years of education: Not on file   Highest education level: Not on file  Occupational History   Not on file  Tobacco Use   Smoking status: Every Day    Current packs/day: 1.00    Average packs/day: 1 pack/day for 20.9 years (20.9 ttl pk-yrs)    Types: Cigarettes    Start date: 12/08/2002   Smokeless tobacco: Never  Vaping Use   Vaping status: Never Used  Substance and Sexual Activity   Alcohol use: Not Currently    Comment: drinks 3-4 x per week - 3-4 drinks wine/beer   Drug use: Yes    Types: Marijuana    Comment: used 09/28/23   Sexual activity: Yes  Other Topics Concern   Not on file  Social History Narrative   ** Merged History Encounter **       Social Determinants of Health   Financial Resource Strain: Not on file  Food Insecurity: Not on file  Transportation Needs: Not on file  Physical Activity: Not on file  Stress: Not on file  Social Connections: Not on file    No Known Allergies   Current Outpatient Medications:    acetaminophen (TYLENOL) 500 MG tablet, Take 500 mg by mouth every 6 (six) hours as needed., Disp: , Rfl:    sulfamethoxazole-trimethoprim (BACTRIM DS) 800-160 MG tablet, Take 2 tablets by mouth 2 (two) times daily., Disp: 140 tablet, Rfl: 0   ibuprofen (ADVIL) 200 MG tablet, Take 200 mg by mouth every 6 (six) hours as needed. (Patient not taking: Reported on 10/28/2023), Disp: , Rfl:     Review of Systems  Constitutional:  Negative for activity change, appetite change, chills, diaphoresis, fatigue, fever and unexpected weight change.  HENT:  Negative for congestion, rhinorrhea, sinus pressure, sneezing, sore throat and trouble swallowing.   Eyes:  Negative for photophobia and visual disturbance.  Respiratory:  Negative for cough, chest tightness, shortness of  breath, wheezing and stridor.   Cardiovascular:  Negative for chest pain, palpitations and leg swelling.  Gastrointestinal:  Negative for abdominal distention, abdominal pain, anal bleeding, blood in stool, constipation, diarrhea, nausea and vomiting.  Genitourinary:  Negative for difficulty urinating, dysuria, flank pain and hematuria.  Musculoskeletal:  Negative for arthralgias, back pain, gait problem, joint swelling and myalgias.  Skin:  Negative for color change, pallor, rash and wound.  Neurological:  Negative for dizziness, tremors, weakness and light-headedness.  Hematological:  Negative for adenopathy. Does not bruise/bleed easily.  Psychiatric/Behavioral:  Negative for agitation, behavioral problems, confusion, decreased concentration, dysphoric mood and sleep disturbance.        Objective:   Physical Exam Constitutional:      Appearance: He is well-developed.  HENT:     Head: Normocephalic  and atraumatic.  Eyes:     Conjunctiva/sclera: Conjunctivae normal.  Cardiovascular:     Rate and Rhythm: Normal rate and regular rhythm.  Pulmonary:     Effort: Pulmonary effort is normal. No respiratory distress.     Breath sounds: No wheezing.  Abdominal:     General: There is no distension.     Palpations: Abdomen is soft.  Musculoskeletal:        General: No tenderness. Normal range of motion.     Cervical back: Normal range of motion and neck supple.  Skin:    General: Skin is warm and dry.     Coloration: Skin is not pale.     Findings: No erythema or rash.  Neurological:     General: No focal deficit present.     Mental Status: He is alert and oriented to person, place, and time.  Psychiatric:        Mood and Affect: Mood normal.        Behavior: Behavior normal.        Thought Content: Thought content normal.        Judgment: Judgment normal.    Fingers 10/28/2023:            Assessment & Plan:   Assessment and Plan    Osteomyelitis  of digits that have  been amputated and  the right index finger Improvement noted with Bactrim DS twice daily. No current pain or swelling. C-reactive protein was normal on last check. -Continue Bactrim DS twice daily for a total of 5 weeks from 10/14/2023. -Check kidney function and BMP  and inflammatory markers. -Return for follow-up in early December.  Past Hepatitis B infection Unclear if the infection was cleared. -Order Hepatitis B panel to confirm status check HCV, hepatitis A serologies.  General Health Maintenance -Order HIV test for thoroughness.  Vaccine counseling -Declined flu and COVID vaccines.      I have personally spent 40 minutes involved in face-to-face and non-face-to-face activities for this patient on the day of the visit. Professional time spent includes the following activities: Preparing to see the patient (review of tests), Obtaining and/or reviewing separately obtained history (admission/discharge record), Performing a medically appropriate examination and/or evaluation , Ordering medications/tests/procedures, referring and communicating with other health care professionals, Documenting clinical information in the EMR, Independently interpreting results (not separately reported), Communicating results to the patient/family/caregiver, Counseling and educating the patient/family/caregiver and Care coordination (not separately reported).

## 2023-10-29 ENCOUNTER — Other Ambulatory Visit: Payer: Self-pay | Admitting: Infectious Disease

## 2023-10-29 ENCOUNTER — Telehealth: Payer: Self-pay

## 2023-10-29 LAB — CBC WITH DIFFERENTIAL/PLATELET
Absolute Lymphocytes: 1136 {cells}/uL (ref 850–3900)
Absolute Monocytes: 398 {cells}/uL (ref 200–950)
Basophils Absolute: 10 {cells}/uL (ref 0–200)
Basophils Relative: 0.4 %
Eosinophils Absolute: 39 {cells}/uL (ref 15–500)
Eosinophils Relative: 1.5 %
HCT: 41.7 % (ref 38.5–50.0)
Hemoglobin: 13.6 g/dL (ref 13.2–17.1)
MCH: 30.6 pg (ref 27.0–33.0)
MCHC: 32.6 g/dL (ref 32.0–36.0)
MCV: 93.7 fL (ref 80.0–100.0)
MPV: 11.3 fL (ref 7.5–12.5)
Monocytes Relative: 15.3 %
Neutro Abs: 1017 {cells}/uL — ABNORMAL LOW (ref 1500–7800)
Neutrophils Relative %: 39.1 %
Platelets: 104 10*3/uL — ABNORMAL LOW (ref 140–400)
RBC: 4.45 10*6/uL (ref 4.20–5.80)
RDW: 12.8 % (ref 11.0–15.0)
Total Lymphocyte: 43.7 %
WBC: 2.6 10*3/uL — ABNORMAL LOW (ref 3.8–10.8)

## 2023-10-29 LAB — BASIC METABOLIC PANEL WITH GFR
BUN/Creatinine Ratio: 6 (calc) (ref 6–22)
BUN: 5 mg/dL — ABNORMAL LOW (ref 7–25)
CO2: 28 mmol/L (ref 20–32)
Calcium: 8.6 mg/dL (ref 8.6–10.3)
Chloride: 103 mmol/L (ref 98–110)
Creat: 0.85 mg/dL (ref 0.60–1.26)
Glucose, Bld: 98 mg/dL (ref 65–99)
Potassium: 4.8 mmol/L (ref 3.5–5.3)
Sodium: 137 mmol/L (ref 135–146)
eGFR: 115 mL/min/{1.73_m2} (ref >=60)

## 2023-10-29 LAB — SEDIMENTATION RATE: Sed Rate: 2 mm/h (ref 0–15)

## 2023-10-29 LAB — HIV ANTIBODY (ROUTINE TESTING W REFLEX): HIV 1&2 Ab, 4th Generation: NONREACTIVE

## 2023-10-29 LAB — HEPATITIS B SURFACE ANTIGEN: Hepatitis B Surface Ag: NONREACTIVE

## 2023-10-29 LAB — HEPATITIS B SURFACE ANTIBODY, QUANTITATIVE: Hep B S AB Quant (Post): 543 m[IU]/mL (ref >=10)

## 2023-10-29 LAB — HEPATITIS A ANTIBODY, TOTAL: Hepatitis A AB,Total: REACTIVE — AB

## 2023-10-29 LAB — C-REACTIVE PROTEIN: CRP: <3 mg/L (ref <8.0)

## 2023-10-29 MED ORDER — CIPROFLOXACIN HCL 500 MG PO TABS: 500.0000 mg | ORAL_TABLET | Freq: Two times a day (BID) | ORAL | 1 refills | Status: DC

## 2023-10-29 NOTE — Telephone Encounter (Signed)
-----   Message from Berthold sent at 10/29/2023  8:36 AM EST ----- Regarding: wbc and platelets going down he needs to stop bactrim and we can use levaquin but he needs to not exert himself at work  ----- Message ----- From: Leory Plowman, [facility] Lab Results In Sent: 10/28/2023  11:10 PM EST To: Randall Hiss, MD

## 2023-10-30 ENCOUNTER — Telehealth: Payer: Self-pay

## 2023-10-30 ENCOUNTER — Other Ambulatory Visit: Payer: Self-pay

## 2023-10-30 LAB — FUNGAL ORGANISM REFLEX

## 2023-10-30 LAB — FUNGUS CULTURE WITH STAIN

## 2023-10-30 LAB — FUNGUS CULTURE RESULT

## 2023-10-30 MED ORDER — CIPROFLOXACIN HCL 500 MG PO TABS: 500.0000 mg | ORAL_TABLET | Freq: Two times a day (BID) | ORAL | 1 refills | Status: DC

## 2023-10-30 NOTE — Telephone Encounter (Signed)
Patient aware to stop bactrim and to start ciprofloxacin.   Per Dr.Van Dam - Levaquin isn't covered by patient insurance. RX for ciprofloxacin sent to [facility] in [place].  Fleda Pagel [name], CMA

## 2023-10-30 NOTE — Telephone Encounter (Signed)
Patient aware of results.    Alan Herrera [name], CMA

## 2023-10-30 NOTE — Telephone Encounter (Signed)
-----   Message from [facility] sent at 10/29/2023  5:00 PM EST ----- He does NOT have hep B or HIV, he has evidence of prior infection vs vaccination for Hep A ----- Message ----- From: Janace Hoard Lab Results In Sent: 10/28/2023  11:10 PM EST To: Randall Hiss, MD

## 2023-10-30 NOTE — Telephone Encounter (Signed)
-----   Message from [facility] sent at 10/29/2023  5:00 PM EST ----- He does NOT have hep B or HIV, he has evidence of prior infection vs vaccination for Hep A ----- Message ----- From: Janace Hoard Lab Results In Sent: 10/28/2023  11:10 PM EST To: Randall Hiss, MD

## 2023-11-12 ENCOUNTER — Other Ambulatory Visit: Payer: Self-pay

## 2023-11-12 ENCOUNTER — Ambulatory Visit (INDEPENDENT_AMBULATORY_CARE_PROVIDER_SITE_OTHER): Payer: MEDICAID | Admitting: Infectious Disease

## 2023-11-12 ENCOUNTER — Encounter: Payer: Self-pay | Admitting: Infectious Disease

## 2023-11-12 VITALS — BP 126/84 | HR 64 | Temp 97.3°F | Wt 124.0 lb

## 2023-11-12 DIAGNOSIS — B1910 Unspecified viral hepatitis B without hepatic coma: Secondary | ICD-10-CM | POA: Diagnosis not present

## 2023-11-12 DIAGNOSIS — D72818 Other decreased white blood cell count: Secondary | ICD-10-CM

## 2023-11-12 DIAGNOSIS — S62623G Displaced fracture of medial phalanx of left middle finger, subsequent encounter for fracture with delayed healing: Secondary | ICD-10-CM

## 2023-11-12 DIAGNOSIS — B9689 Other specified bacterial agents as the cause of diseases classified elsewhere: Secondary | ICD-10-CM

## 2023-11-12 DIAGNOSIS — B964 Proteus (mirabilis) (morganii) as the cause of diseases classified elsewhere: Secondary | ICD-10-CM | POA: Diagnosis not present

## 2023-11-12 DIAGNOSIS — M86141 Other acute osteomyelitis, right hand: Secondary | ICD-10-CM

## 2023-11-12 DIAGNOSIS — S62316G Displaced fracture of base of fifth metacarpal bone, right hand, subsequent encounter for fracture with delayed healing: Secondary | ICD-10-CM

## 2023-11-12 DIAGNOSIS — M86142 Other acute osteomyelitis, left hand: Secondary | ICD-10-CM

## 2023-11-12 DIAGNOSIS — R55 Syncope and collapse: Secondary | ICD-10-CM

## 2023-11-12 DIAGNOSIS — B159 Hepatitis A without hepatic coma: Secondary | ICD-10-CM

## 2023-11-12 DIAGNOSIS — D696 Thrombocytopenia, unspecified: Secondary | ICD-10-CM

## 2023-11-12 NOTE — Progress Notes (Signed)
Subjective:  Complaint follow-up for osteomyelitis involving digits in the right hand  Patient ID: Alan Herrera., male    DOB: [birth year], 37 y.o.   MRN: [id number]  HPI    Discussed the use of AI scribe software for clinical note transcription with the patient, who gave verbal consent to proceed.  History of Present Illness   Alan Herrera, a 37 year old man, presents for follow-up after a lawn mower injury that resulted in the amputation of his right middle finger and open fractures of the right ring and index fingers. He was admitted for irrigation and debridement of the right index finger and amputation of the right ring finger due to concerns of a nonhealing wound and potential osteomyelitis. He was started on antibiotics, but had to switch due to toxicity. He reports that his non-amputated finger is doing well, but he is still experiencing pain in the amputated finger. He describes the pain as joint-like, located where the finger used to bend. He is still under the care of his surgeon and reports that his surgeon does not believe there is any bone infection at his index finger.   Cultures: Proteus vulgaris, sensitive to Bactrim and ciprofloxacin; Serratia marcescens, sensitive to ciprofloxacin and Bactrim; Morganella and Citrobacter, sensitive to Bactrim and ciprofloxacin (09/30/2023)    CBC: Leukopenia and thrombocytopenia Inflammatory markers: Normal Hepatitis B antigen: Negative Hepatitis B antibodies: 543 Hepatitis A antibodies: Positive HIV: Negative  RADIOLOGY X-ray of right hand: Concern for nonhealing wound and potential osteomyelitis of the ring finger and index finger (09/30/2023)         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   Pneumonia    x 1    Past Surgical History:  Procedure Laterality Date   AMPUTATION Right  07/30/2023   Procedure: REVISION AMPUTATION OF MIDDLE FINGER WITH FULL THICKNESS SKIN GRAFTING TO RING FINGER;  Surgeon: Marlyne Beards, MD;  Location: [facility];  Service: Orthopedics;  Laterality: Right;   AMPUTATION Right 09/30/2023   Procedure: AMPUTATION OF RIGHT RING FINGER;  Surgeon: Marlyne Beards, MD;  Location: [facility];  Service: Orthopedics;  Laterality: Right;  local  60   CLOSED REDUCTION FINGER WITH PERCUTANEOUS PINNING Right 07/30/2023   Procedure: IRRIGATION AND DEBRIDEMENT PERCUTANEOUS PINNING RIGHT RING FINGER MIDDLE PHALANX FRACTURE;  Surgeon: Marlyne Beards, MD;  Location: [facility];  Service: Orthopedics;  Laterality: Right;   INCISION AND DRAINAGE OF WOUND Right 07/30/2023   Procedure: IRRIGATION AND DEBRIDEMENT MIDDLE AND RING FINGER WOUND;  Surgeon: Marlyne Beards, MD;  Location: [facility];  Service: Orthopedics;  Laterality: Right;   INCISION AND DRAINAGE OF WOUND Right 09/30/2023   Procedure: IRRIGATION AND DEBRIDEMENT OF RIGHT INDEX FINGER TIP;  Surgeon: Marlyne Beards, MD;  Location: [facility];  Service: Orthopedics;  Laterality: Right;  local 60    No family history on file.    Social History   Socioeconomic History   Marital status: Single    Spouse name: Not on file   Number of children: Not on file   Years of education: Not on file   Highest education level: Not on file  Occupational History   Not on file  Tobacco Use   Smoking status: Every Day    Current packs/day: 1.00  Average packs/day: 1 pack/day for 20.9 years (20.9 ttl pk-yrs)    Types: Cigarettes    Start date: 12/08/2002   Smokeless tobacco: Never  Vaping Use   Vaping status: Never Used  Substance and Sexual Activity   Alcohol use: Not Currently    Comment: drinks 3-4 x per week - 3-4 drinks wine/beer   Drug use: Yes    Types: Marijuana    Comment: used 09/28/23   Sexual activity: Yes  Other Topics Concern   Not on file  Social History  Narrative   ** Merged History Encounter **       Social Determinants of Health   Financial Resource Strain: Not on file  Food Insecurity: Not on file  Transportation Needs: Not on file  Physical Activity: Not on file  Stress: Not on file  Social Connections: Not on file    Allergies  Allergen Reactions   Bactrim [Sulfamethoxazole-Trimethoprim] Other (See Comments)    Leukopenia and TTpenia     Current Outpatient Medications:    acetaminophen (TYLENOL) 500 MG tablet, Take 500 mg by mouth every 6 (six) hours as needed., Disp: , Rfl:    ciprofloxacin (CIPRO) 500 MG tablet, Take 1 tablet (500 mg total) by mouth 2 (two) times daily., Disp: 42 tablet, Rfl: 1   ibuprofen (ADVIL) 200 MG tablet, Take 200 mg by mouth every 6 (six) hours as needed. (Patient not taking: Reported on 10/28/2023), Disp: , Rfl:     Review of Systems  Constitutional:  Negative for activity change, appetite change, chills, diaphoresis, fatigue, fever and unexpected weight change.  HENT:  Negative for congestion, rhinorrhea, sinus pressure, sneezing, sore throat and trouble swallowing.   Eyes:  Negative for photophobia and visual disturbance.  Respiratory:  Negative for cough, chest tightness, shortness of breath, wheezing and stridor.   Cardiovascular:  Negative for chest pain, palpitations and leg swelling.  Gastrointestinal:  Negative for abdominal distention, abdominal pain, anal bleeding, blood in stool, constipation, diarrhea, nausea and vomiting.  Genitourinary:  Negative for difficulty urinating, dysuria, flank pain and hematuria.  Musculoskeletal:  Negative for arthralgias, back pain, gait problem, joint swelling and myalgias.  Skin:  Negative for color change, pallor, rash and wound.  Neurological:  Negative for dizziness, tremors, weakness and light-headedness.  Hematological:  Negative for adenopathy. Does not bruise/bleed easily.  Psychiatric/Behavioral:  Negative for agitation, behavioral problems,  confusion, decreased concentration, dysphoric mood and sleep disturbance.        Objective:   Physical Exam Constitutional:      Appearance: He is well-developed.  HENT:     Head: Normocephalic and atraumatic.  Eyes:     Conjunctiva/sclera: Conjunctivae normal.  Cardiovascular:     Rate and Rhythm: Normal rate and regular rhythm.  Pulmonary:     Effort: Pulmonary effort is normal. No respiratory distress.     Breath sounds: No wheezing.  Abdominal:     General: There is no distension.     Palpations: Abdomen is soft.  Musculoskeletal:        General: Tenderness present.     Cervical back: Normal range of motion and neck supple.  Skin:    General: Skin is warm and dry.     Coloration: Skin is not pale.     Findings: No erythema or rash.  Neurological:     General: No focal deficit present.     Mental Status: He is alert and oriented to person, place, and time.  Psychiatric:  Mood and Affect: Mood normal.        Behavior: Behavior normal.        Thought Content: Thought content normal.        Judgment: Judgment normal.    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 Cultures grew Proteus vulgaris, Serratia marcescens, Morganella, and Citrobacter, all sensitive to Bactrim and Ciprofloxacin. Initially treated with Bactrim, but developed leukopenia and thrombocytopenia, so switched to Ciprofloxacin. Inflammatory markers were normal. Surgeon did not see evidence of ongoing bone infection on recent imaging. -Check CBC to ensure resolution of leukopenia and thrombocytopenia. -If labs are normal, discontinue antibiotics. -Return for follow-up in January. -If symptoms of infection recur, restart Ciprofloxacin and contact office.  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 -Continue to monitor and manage as needed.  Hepatitis B ? Past medical history of Hepatitis B, but recent labs show negative antigen and high antibody titer, indicating either successful vaccination or past infection with full recovery. -No further action needed.  Hepatitis A Positive antibodies, indicating either successful vaccination or past infection. -No further action needed.  Thrombocytopenia and Leukopenia from Bactrim: rechecking CBC   Vasovagal dizziness: during phlebotomy lab draw he had dizziness and drop in his blood pressure. His symptoms resolved however and even with standing had no dizziness.

## 2023-11-13 LAB — CBC WITH DIFFERENTIAL/PLATELET
Absolute Lymphocytes: 1494 {cells}/uL (ref 850–3900)
Absolute Monocytes: 441 {cells}/uL (ref 200–950)
Basophils Absolute: 18 {cells}/uL (ref 0–200)
Basophils Relative: 0.4 %
Eosinophils Absolute: 0 {cells}/uL — ABNORMAL LOW (ref 15–500)
Eosinophils Relative: 0 %
HCT: 41.9 % (ref 38.5–50.0)
Hemoglobin: 13.8 g/dL (ref 13.2–17.1)
MCH: 30.6 pg (ref 27.0–33.0)
MCHC: 32.9 g/dL (ref 32.0–36.0)
MCV: 92.9 fL (ref 80.0–100.0)
MPV: 10.3 fL (ref 7.5–12.5)
Monocytes Relative: 9.8 %
Neutro Abs: 2547 {cells}/uL (ref 1500–7800)
Neutrophils Relative %: 56.6 %
Platelets: 236 10*3/uL (ref 140–400)
RBC: 4.51 10*6/uL (ref 4.20–5.80)
RDW: 12.4 % (ref 11.0–15.0)
Total Lymphocyte: 33.2 %
WBC: 4.5 10*3/uL (ref 3.8–10.8)

## 2023-11-13 LAB — BASIC METABOLIC PANEL WITH GFR
BUN: 9 mg/dL (ref 7–25)
CO2: 28 mmol/L (ref 20–32)
Calcium: 8.8 mg/dL (ref 8.6–10.3)
Chloride: 106 mmol/L (ref 98–110)
Creat: 0.87 mg/dL (ref 0.60–1.26)
Glucose, Bld: 79 mg/dL (ref 65–99)
Potassium: 4.3 mmol/L (ref 3.5–5.3)
Sodium: 139 mmol/L (ref 135–146)
eGFR: 115 mL/min/{1.73_m2} (ref >=60)

## 2023-11-13 LAB — C-REACTIVE PROTEIN: CRP: <3 mg/L (ref <8.0)

## 2023-11-13 LAB — SEDIMENTATION RATE: Sed Rate: 2 mm/h (ref 0–15)

## 2023-11-15 LAB — ACID FAST CULTURE WITH REFLEXED SENSITIVITIES (MYCOBACTERIA): Acid Fast Culture: NEGATIVE

## 2023-12-14 LAB — ACID FAST CULTURE WITH REFLEXED SENSITIVITIES (MYCOBACTERIA): Acid Fast Culture: NEGATIVE

## 2023-12-22 ENCOUNTER — Encounter: Payer: Self-pay | Admitting: Infectious Disease

## 2023-12-22 DIAGNOSIS — Z7185 Encounter for immunization safety counseling: Secondary | ICD-10-CM

## 2023-12-22 HISTORY — DX: Encounter for immunization safety counseling: Z71.85
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
## 2023-12-23 ENCOUNTER — Encounter: Payer: Self-pay | Admitting: Infectious Disease

## 2023-12-23 ENCOUNTER — Other Ambulatory Visit: Payer: Self-pay

## 2023-12-23 ENCOUNTER — Ambulatory Visit (INDEPENDENT_AMBULATORY_CARE_PROVIDER_SITE_OTHER): Payer: MEDICAID | Admitting: Infectious Disease

## 2023-12-23 VITALS — BP 146/89 | HR 69 | Temp 97.9°F | Ht 66.0 in | Wt 124.0 lb

## 2023-12-23 DIAGNOSIS — Z7185 Encounter for immunization safety counseling: Secondary | ICD-10-CM

## 2023-12-23 DIAGNOSIS — S62316G Displaced fracture of base of fifth metacarpal bone, right hand, subsequent encounter for fracture with delayed healing: Secondary | ICD-10-CM

## 2023-12-23 DIAGNOSIS — S6291XA Unspecified fracture of right wrist and hand, initial encounter for closed fracture: Secondary | ICD-10-CM

## 2023-12-23 DIAGNOSIS — M86142 Other acute osteomyelitis, left hand: Secondary | ICD-10-CM

## 2023-12-23 NOTE — Patient Instructions (Signed)
 He wants to have lab appt come back and do labs and should be done with him lying down

## 2023-12-28 NOTE — Addendum Note (Signed)
Addended by: Tressa Busman T on: 12/28/2023 02:43 PM   Modules accepted: Orders

## 2023-12-29 ENCOUNTER — Other Ambulatory Visit: Payer: MEDICAID

## 2024-01-05 ENCOUNTER — Other Ambulatory Visit: Payer: MEDICAID

## 2024-01-05 ENCOUNTER — Other Ambulatory Visit: Payer: Self-pay | Admitting: Internal Medicine

## 2024-01-07 ENCOUNTER — Other Ambulatory Visit: Payer: Self-pay

## 2024-01-07 ENCOUNTER — Other Ambulatory Visit: Payer: MEDICAID

## 2024-01-07 DIAGNOSIS — M86142 Other acute osteomyelitis, left hand: Secondary | ICD-10-CM

## 2024-01-08 LAB — CBC WITH DIFFERENTIAL/PLATELET
Absolute Lymphocytes: 2180 {cells}/uL (ref 850–3900)
Absolute Monocytes: 662 {cells}/uL (ref 200–950)
Basophils Absolute: 83 {cells}/uL (ref 0–200)
Basophils Relative: 0.9 %
Eosinophils Absolute: 147 {cells}/uL (ref 15–500)
Eosinophils Relative: 1.6 %
HCT: 43 % (ref 38.5–50.0)
Hemoglobin: 14.2 g/dL (ref 13.2–17.1)
MCH: 30.9 pg (ref 27.0–33.0)
MCHC: 33 g/dL (ref 32.0–36.0)
MCV: 93.5 fL (ref 80.0–100.0)
MPV: 10.7 fL (ref 7.5–12.5)
Monocytes Relative: 7.2 %
Neutro Abs: 6127 {cells}/uL (ref 1500–7800)
Neutrophils Relative %: 66.6 %
Platelets: 255 10*3/uL (ref 140–400)
RBC: 4.6 10*6/uL (ref 4.20–5.80)
RDW: 13.8 % (ref 11.0–15.0)
Total Lymphocyte: 23.7 %
WBC: 9.2 10*3/uL (ref 3.8–10.8)

## 2024-01-08 LAB — BASIC METABOLIC PANEL WITH GFR
BUN: 14 mg/dL (ref 7–25)
CO2: 26 mmol/L (ref 20–32)
Calcium: 9.6 mg/dL (ref 8.6–10.3)
Chloride: 105 mmol/L (ref 98–110)
Creat: 1.05 mg/dL (ref 0.60–1.26)
Glucose, Bld: 76 mg/dL (ref 65–99)
Potassium: 4.4 mmol/L (ref 3.5–5.3)
Sodium: 141 mmol/L (ref 135–146)
eGFR: 94 mL/min/{1.73_m2} (ref >=60)

## 2024-01-08 LAB — SEDIMENTATION RATE: Sed Rate: 2 mm/h (ref 0–15)

## 2024-01-08 LAB — C-REACTIVE PROTEIN: CRP: <3 mg/L (ref <8.0)

## 2024-01-27 ENCOUNTER — Ambulatory Visit: Payer: MEDICAID | Admitting: Infectious Disease

## 2024-02-09 NOTE — Progress Notes (Unsigned)
   Subjective:    Patient ID: Alan Herrera., male    DOB: 08/06/1986, 38 y.o.   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   Pneumonia    x 1   Vaccine counseling 12/22/2023    Past Surgical History:  Procedure Laterality Date   AMPUTATION Right 07/30/2023   Procedure: REVISION AMPUTATION OF MIDDLE FINGER WITH FULL THICKNESS SKIN GRAFTING TO RING FINGER;  Surgeon: Marlyne Beards, MD;  Location: [facility];  Service: Orthopedics;  Laterality: Right;   AMPUTATION Right 09/30/2023   Procedure: AMPUTATION OF RIGHT RING FINGER;  Surgeon: Marlyne Beards, MD;  Location: [facility];  Service: Orthopedics;  Laterality: Right;  local  60   CLOSED REDUCTION FINGER WITH PERCUTANEOUS PINNING Right 07/30/2023   Procedure: IRRIGATION AND DEBRIDEMENT PERCUTANEOUS PINNING RIGHT RING FINGER MIDDLE PHALANX FRACTURE;  Surgeon: Marlyne Beards, MD;  Location: [facility];  Service: Orthopedics;  Laterality: Right;   INCISION AND DRAINAGE OF WOUND Right 07/30/2023   Procedure: IRRIGATION AND DEBRIDEMENT MIDDLE AND RING FINGER WOUND;  Surgeon: Marlyne Beards, MD;  Location: [facility];  Service: Orthopedics;  Laterality: Right;   INCISION AND DRAINAGE OF WOUND Right 09/30/2023   Procedure: IRRIGATION AND DEBRIDEMENT OF RIGHT INDEX FINGER TIP;  Surgeon: Marlyne Beards, MD;  Location: [facility];  Service: Orthopedics;  Laterality: Right;  local 60    No family history on file.    Social History   Socioeconomic History   Marital status: Single    Spouse name: Not on file   Number of children: Not on file   Years of education: Not on file   Highest education level: Not on file  Occupational History   Not on file  Tobacco Use   Smoking status: Every Day    Current packs/day: 1.00    Average packs/day: 1  pack/day for 21.2 years (21.2 ttl pk-yrs)    Types: Cigarettes    Start date: 12/08/2002   Smokeless tobacco: Never  Vaping Use   Vaping status: Never Used  Substance and Sexual Activity   Alcohol use: Not Currently    Comment: drinks 3-4 x per week - 3-4 drinks wine/beer   Drug use: Yes    Types: Marijuana    Comment: used 09/28/23   Sexual activity: Yes  Other Topics Concern   Not on file  Social History Narrative   ** Merged History Encounter **       Social Drivers of Corporate investment banker Strain: Not on file  Food Insecurity: Not on file  Transportation Needs: Not on file  Physical Activity: Not on file  Stress: Not on file  Social Connections: Not on file    Allergies  Allergen Reactions   Bactrim [Sulfamethoxazole-Trimethoprim] Other (See Comments)    Leukopenia and TTpenia     Current Outpatient Medications:    traMADol (ULTRAM) 50 MG tablet, Take 50 mg by mouth every 6 (six) hours as needed., Disp: , Rfl:     Review of Systems     Objective:   Physical Exam        Assessment & Plan:

## 2024-02-10 ENCOUNTER — Other Ambulatory Visit: Payer: Self-pay

## 2024-02-10 ENCOUNTER — Ambulatory Visit (INDEPENDENT_AMBULATORY_CARE_PROVIDER_SITE_OTHER): Payer: MEDICAID | Admitting: Infectious Disease

## 2024-02-10 VITALS — BP 148/93 | HR 67 | Temp 98.0°F

## 2024-02-10 DIAGNOSIS — S68119D Complete traumatic metacarpophalangeal amputation of unspecified finger, subsequent encounter: Secondary | ICD-10-CM

## 2024-02-10 DIAGNOSIS — M86142 Other acute osteomyelitis, left hand: Secondary | ICD-10-CM | POA: Diagnosis not present

## 2024-02-10 DIAGNOSIS — S61011A Laceration without foreign body of right thumb without damage to nail, initial encounter: Secondary | ICD-10-CM

## 2024-02-10 DIAGNOSIS — S62623G Displaced fracture of medial phalanx of left middle finger, subsequent encounter for fracture with delayed healing: Secondary | ICD-10-CM

## 2024-02-11 LAB — CBC WITH DIFFERENTIAL/PLATELET
Absolute Lymphocytes: 1911 {cells}/uL (ref 850–3900)
Absolute Monocytes: 476 {cells}/uL (ref 200–950)
Basophils Absolute: 61 {cells}/uL (ref 0–200)
Basophils Relative: 0.9 %
Eosinophils Absolute: 218 {cells}/uL (ref 15–500)
Eosinophils Relative: 3.2 %
HCT: 43.5 % (ref 38.5–50.0)
Hemoglobin: 14.7 g/dL (ref 13.2–17.1)
MCH: 32.1 pg (ref 27.0–33.0)
MCHC: 33.8 g/dL (ref 32.0–36.0)
MCV: 95 fL (ref 80.0–100.0)
MPV: 10.3 fL (ref 7.5–12.5)
Monocytes Relative: 7 %
Neutro Abs: 4134 {cells}/uL (ref 1500–7800)
Neutrophils Relative %: 60.8 %
Platelets: 220 10*3/uL (ref 140–400)
RBC: 4.58 10*6/uL (ref 4.20–5.80)
RDW: 13.3 % (ref 11.0–15.0)
Total Lymphocyte: 28.1 %
WBC: 6.8 10*3/uL (ref 3.8–10.8)

## 2024-02-11 LAB — BASIC METABOLIC PANEL WITH GFR
BUN: 14 mg/dL (ref 7–25)
CO2: 26 mmol/L (ref 20–32)
Calcium: 9.2 mg/dL (ref 8.6–10.3)
Chloride: 105 mmol/L (ref 98–110)
Creat: 0.89 mg/dL (ref 0.60–1.26)
Glucose, Bld: 110 mg/dL — ABNORMAL HIGH (ref 65–99)
Potassium: 4.1 mmol/L (ref 3.5–5.3)
Sodium: 138 mmol/L (ref 135–146)
eGFR: 114 mL/min/{1.73_m2} (ref >=60)

## 2024-02-11 LAB — C-REACTIVE PROTEIN: CRP: <3 mg/L (ref <8.0)

## 2024-02-11 LAB — SEDIMENTATION RATE: Sed Rate: 2 mm/h (ref 0–15)

## 2024-05-30 ENCOUNTER — Other Ambulatory Visit: Payer: Self-pay

## 2024-05-30 ENCOUNTER — Encounter (HOSPITAL_BASED_OUTPATIENT_CLINIC_OR_DEPARTMENT_OTHER): Payer: Self-pay

## 2024-05-30 ENCOUNTER — Emergency Department (HOSPITAL_BASED_OUTPATIENT_CLINIC_OR_DEPARTMENT_OTHER)
Admission: EM | Admit: 2024-05-30 | Discharge: 2024-05-30 | Disposition: A | Discharge status: Home / Self Care | Payer: MEDICAID | Attending: Emergency Medicine | Admitting: Emergency Medicine

## 2024-05-30 DIAGNOSIS — T162XXA Foreign body in left ear, initial encounter: Secondary | ICD-10-CM | POA: Diagnosis present

## 2024-05-30 DIAGNOSIS — W448XXA Other foreign body entering into or through a natural orifice, initial encounter: Secondary | ICD-10-CM | POA: Diagnosis not present

## 2024-05-30 MED ORDER — LIDOCAINE VISCOUS HCL 2 % MT SOLN
15.0000 mL | Freq: Once | OROMUCOSAL | Status: AC
  Administered 2024-05-30: 15 mL via OROMUCOSAL
  Filled 2024-05-30: qty 15

## 2024-05-30 MED ORDER — CIPRO HC 0.2-1 % OT SUSP: 3.0000 [drp] | Freq: Two times a day (BID) | OTIC | 0 refills | Status: AC

## 2024-05-30 NOTE — ED Notes (Addendum)
 EDP able to remove small parts of the insect.  Will start him on antibiotic drops and has instructed family member to flush ear canal out twice a day.  Pt to F/U with PCP in 5 days to recheck.  Pt and family member verbalize understanding of above instructions

## 2024-05-30 NOTE — ED Triage Notes (Signed)
 Pt states he has a bug in his his ear.   States he can still feel in moving

## 2024-05-30 NOTE — ED Provider Notes (Signed)
 [place] [facility] AT [facility] Provider Note   CSN: [id number] Arrival date & time: 05/30/24  0017     History Chief Complaint  Patient presents with   Foreign Body in Ear    HPI Alan Herrera. is a 38 y.o. male presenting for foreign body in his left ear.  Patient states he felt a bug crawl in and tried multiple attempts to remove it, multiple attempts with hydrogen peroxide and by reaching in.   Patient's recorded medical, surgical, social, medication list and allergies were reviewed in the Snapshot window as part of the initial history.   Review of Systems   Review of Systems  Constitutional:  Negative for chills and fever.  HENT:  Positive for ear pain. Negative for sore throat.   Eyes:  Negative for pain and visual disturbance.  Respiratory:  Negative for cough and shortness of breath.   Cardiovascular:  Negative for chest pain and palpitations.  Gastrointestinal:  Negative for abdominal pain and vomiting.  Genitourinary:  Negative for dysuria and hematuria.  Musculoskeletal:  Negative for arthralgias and back pain.  Skin:  Negative for color change and rash.  Neurological:  Negative for seizures and syncope.  All other systems reviewed and are negative.   Physical Exam Updated Vital Signs BP 137/88 (BP Location: Left Arm)   Pulse 72   Temp 98.2 F (36.8 C) (Oral)   Resp 17   Ht 5' 6 (1.676 m)   Wt 59 kg   SpO2 100%   BMI 20.98 kg/m  Physical Exam Vitals and nursing note reviewed.  Constitutional:      General: He is not in acute distress.    Appearance: He is well-developed.  HENT:     Head: Normocephalic and atraumatic.     Comments: Insect appreciated in his left ear canal.  It is macerated and fragmented into multiple segments.  Eyes:     Conjunctiva/sclera: Conjunctivae normal.    Cardiovascular:     Rate and Rhythm: Normal rate and regular rhythm.     Heart sounds: No murmur heard. Pulmonary:     Effort:  Pulmonary effort is normal. No respiratory distress.     Breath sounds: Normal breath sounds.  Abdominal:     Palpations: Abdomen is soft.     Tenderness: There is no abdominal tenderness.   Musculoskeletal:        General: No swelling.     Cervical back: Neck supple.   Skin:    General: Skin is warm and dry.     Capillary Refill: Capillary refill takes less than 2 seconds.   Neurological:     Mental Status: He is alert.   Psychiatric:        Mood and Affect: Mood normal.      ED Course/ Medical Decision Making/ A&P    Procedures .Foreign Body Removal  Date/Time: 05/30/2024 6:07 AM  Performed by: Jerral Meth, MD Authorized by: Jerral Meth, MD  Consent: Verbal consent obtained Risks and benefits: risks, benefits and alternatives were discussed Consent given by: patient Patient understanding: patient states understanding of the procedure being performed Body area: ear Location details: left ear Localization method: magnification, probed and visualized Removal mechanism: curette and alligator forceps Complexity: complex Post-procedure assessment: foreign body removed Patient tolerance: patient tolerated the procedure well with no immediate complications Comments: Many pieces of an insect were removed. Procedure was limited due to patient's pain. 1 leg still appreciated in the otic canal but patient  stated he would rather try ear rinses then any further attempts at removal.     Medications Ordered in ED Medications  lidocaine  (XYLOCAINE ) 2 % viscous mouth solution 15 mL (15 mLs Mouth/Throat Given 05/30/24 0028)    Medical Decision Making:   Foreign body in the left ear.  Foreign body extracted as above. Obvious excoriations all over the ear were present prior to any intervention by myself. Patient was educated on appropriate management of similar condition in the future.  To prevent infection he will be started on ciprofloxacin  otic suspension.  Strict  return precautions and follow-up with PCP reinforced.  Disposition:  I have considered need for hospitalization, however, considering all of the above, I believe this patient is stable for discharge at this time.  Patient/family educated about specific return precautions for given chief complaint and symptoms.  Patient/family educated about follow-up with PCP.     Patient/family expressed understanding of return precautions and need for follow-up. Patient spoken to regarding all imaging and laboratory results and appropriate follow up for these results. All education provided in verbal form with additional information in written form. Time was allowed for answering of patient questions. Patient discharged.    Emergency Department Medication Summary:   Medications  lidocaine  (XYLOCAINE ) 2 % viscous mouth solution 15 mL (15 mLs Mouth/Throat Given 05/30/24 0028)        Clinical Impression:  1. Foreign body of left ear, initial encounter      Discharge   Final Clinical Impression(s) / ED Diagnoses Final diagnoses:  Foreign body of left ear, initial encounter    Rx / DC Orders ED Discharge Orders          Ordered    ciprofloxacin -hydrocortisone (CIPRO  HC) OTIC suspension  2 times daily        05/30/24 0146              Jerral Meth, MD 05/30/24 [phone]
